# Patient Record
Sex: Female | Born: 1980 | Race: Asian | Hispanic: No | Marital: Married | Smoking: Never smoker
Health system: Southern US, Community
[De-identification: ages and names within clinical notes are randomized; demographics above are authoritative.]

## PROBLEM LIST (undated history)

## (undated) ENCOUNTER — Inpatient Hospital Stay (HOSPITAL_COMMUNITY): Payer: Self-pay

## (undated) DIAGNOSIS — Z8781 Personal history of (healed) traumatic fracture: Secondary | ICD-10-CM

## (undated) HISTORY — PX: NO PAST SURGERIES: SHX2092

---

## 2015-01-20 ENCOUNTER — Encounter (HOSPITAL_COMMUNITY): Payer: Self-pay | Admitting: *Deleted

## 2015-01-20 ENCOUNTER — Emergency Department (HOSPITAL_COMMUNITY): Payer: Medicaid Other

## 2015-01-20 ENCOUNTER — Inpatient Hospital Stay (HOSPITAL_COMMUNITY)
Admission: EM | Admit: 2015-01-20 | Discharge: 2015-01-26 | DRG: 964 | Disposition: A | Discharge status: Home Health | Payer: Medicaid Other | Attending: Surgery | Admitting: Surgery

## 2015-01-20 DIAGNOSIS — S0081XA Abrasion of other part of head, initial encounter: Secondary | ICD-10-CM | POA: Diagnosis present

## 2015-01-20 DIAGNOSIS — S2241XA Multiple fractures of ribs, right side, initial encounter for closed fracture: Secondary | ICD-10-CM | POA: Diagnosis present

## 2015-01-20 DIAGNOSIS — S3210XA Unspecified fracture of sacrum, initial encounter for closed fracture: Secondary | ICD-10-CM | POA: Diagnosis present

## 2015-01-20 DIAGNOSIS — S0181XA Laceration without foreign body of other part of head, initial encounter: Secondary | ICD-10-CM | POA: Diagnosis present

## 2015-01-20 DIAGNOSIS — S32591A Other specified fracture of right pubis, initial encounter for closed fracture: Secondary | ICD-10-CM | POA: Diagnosis present

## 2015-01-20 DIAGNOSIS — S27321A Contusion of lung, unilateral, initial encounter: Secondary | ICD-10-CM | POA: Diagnosis present

## 2015-01-20 DIAGNOSIS — S36115A Moderate laceration of liver, initial encounter: Principal | ICD-10-CM | POA: Diagnosis present

## 2015-01-20 DIAGNOSIS — S32810A Multiple fractures of pelvis with stable disruption of pelvic ring, initial encounter for closed fracture: Secondary | ICD-10-CM

## 2015-01-20 DIAGNOSIS — M542 Cervicalgia: Secondary | ICD-10-CM

## 2015-01-20 DIAGNOSIS — S329XXA Fracture of unspecified parts of lumbosacral spine and pelvis, initial encounter for closed fracture: Secondary | ICD-10-CM | POA: Diagnosis present

## 2015-01-20 DIAGNOSIS — S2231XA Fracture of one rib, right side, initial encounter for closed fracture: Secondary | ICD-10-CM

## 2015-01-20 DIAGNOSIS — D62 Acute posthemorrhagic anemia: Secondary | ICD-10-CM | POA: Diagnosis present

## 2015-01-20 DIAGNOSIS — S32301A Unspecified fracture of right ilium, initial encounter for closed fracture: Secondary | ICD-10-CM | POA: Diagnosis present

## 2015-01-20 DIAGNOSIS — S36113A Laceration of liver, unspecified degree, initial encounter: Secondary | ICD-10-CM

## 2015-01-20 LAB — I-STAT CHEM 8, ED
BUN: 15 mg/dL (ref 6–20)
CALCIUM ION: 1.16 mmol/L (ref 1.12–1.23)
Chloride: 102 mmol/L (ref 101–111)
Creatinine, Ser: 0.9 mg/dL (ref 0.44–1.00)
GLUCOSE: 182 mg/dL — AB (ref 70–99)
HEMATOCRIT: 44 % (ref 36.0–46.0)
HEMOGLOBIN: 15 g/dL (ref 12.0–15.0)
POTASSIUM: 3.1 mmol/L — AB (ref 3.5–5.1)
Sodium: 140 mmol/L (ref 135–145)
TCO2: 21 mmol/L (ref 0–100)

## 2015-01-20 LAB — TYPE AND SCREEN
ABO/RH(D): B POS
ANTIBODY SCREEN: NEGATIVE
UNIT DIVISION: 0
Unit division: 0

## 2015-01-20 LAB — CBC
HCT: 33.6 % — ABNORMAL LOW (ref 36.0–46.0)
HEMATOCRIT: 39 % (ref 36.0–46.0)
HEMATOCRIT: 35.5 % — AB (ref 36.0–46.0)
HEMOGLOBIN: 13.4 g/dL (ref 12.0–15.0)
Hemoglobin: 12 g/dL (ref 12.0–15.0)
Hemoglobin: 11.3 g/dL — ABNORMAL LOW (ref 12.0–15.0)
MCH: 30.5 pg (ref 26.0–34.0)
MCH: 30.3 pg (ref 26.0–34.0)
MCH: 29.9 pg (ref 26.0–34.0)
MCHC: 34.4 g/dL (ref 30.0–36.0)
MCHC: 33.8 g/dL (ref 30.0–36.0)
MCHC: 33.6 g/dL (ref 30.0–36.0)
MCV: 88.8 fL (ref 78.0–100.0)
MCV: 89.6 fL (ref 78.0–100.0)
MCV: 88.9 fL (ref 78.0–100.0)
Platelets: 153 10*3/uL (ref 150–400)
Platelets: 111 10*3/uL — ABNORMAL LOW (ref 150–400)
Platelets: 103 10*3/uL — ABNORMAL LOW (ref 150–400)
RBC: 4.39 MIL/uL (ref 3.87–5.11)
RBC: 3.96 MIL/uL (ref 3.87–5.11)
RBC: 3.78 MIL/uL — AB (ref 3.87–5.11)
RDW: 12.7 % (ref 11.5–15.5)
RDW: 12.8 % (ref 11.5–15.5)
RDW: 12.8 % (ref 11.5–15.5)
WBC: 10.7 10*3/uL — AB (ref 4.0–10.5)
WBC: 13.5 10*3/uL — ABNORMAL HIGH (ref 4.0–10.5)
WBC: 11 10*3/uL — AB (ref 4.0–10.5)

## 2015-01-20 LAB — COMPREHENSIVE METABOLIC PANEL
ALT: 204 U/L — ABNORMAL HIGH (ref 14–54)
AST: 324 U/L — ABNORMAL HIGH (ref 15–41)
Albumin: 3.9 g/dL (ref 3.5–5.0)
Alkaline Phosphatase: 47 U/L (ref 38–126)
Anion gap: 11 (ref 5–15)
BUN: 13 mg/dL (ref 6–20)
CHLORIDE: 103 mmol/L (ref 101–111)
CO2: 23 mmol/L (ref 22–32)
CREATININE: 0.95 mg/dL (ref 0.44–1.00)
Calcium: 8.8 mg/dL — ABNORMAL LOW (ref 8.9–10.3)
Glucose, Bld: 180 mg/dL — ABNORMAL HIGH (ref 70–99)
Potassium: 3.2 mmol/L — ABNORMAL LOW (ref 3.5–5.1)
Sodium: 137 mmol/L (ref 135–145)
Total Bilirubin: 0.9 mg/dL (ref 0.3–1.2)
Total Protein: 6.7 g/dL (ref 6.5–8.1)

## 2015-01-20 LAB — URINE MICROSCOPIC-ADD ON

## 2015-01-20 LAB — URINALYSIS, ROUTINE W REFLEX MICROSCOPIC
Bilirubin Urine: NEGATIVE
Glucose, UA: NEGATIVE mg/dL
KETONES UR: NEGATIVE mg/dL
LEUKOCYTES UA: NEGATIVE
Nitrite: NEGATIVE
PROTEIN: 30 mg/dL — AB
Specific Gravity, Urine: >1.046 — ABNORMAL HIGH (ref 1.005–1.030)
Urobilinogen, UA: 0.2 mg/dL (ref 0.0–1.0)
pH: 5.5 (ref 5.0–8.0)

## 2015-01-20 LAB — MRSA PCR SCREENING: MRSA by PCR: NEGATIVE

## 2015-01-20 LAB — ETHANOL

## 2015-01-20 LAB — POC URINE PREG, ED: PREG TEST UR: NEGATIVE

## 2015-01-20 LAB — PROTIME-INR
INR: 1 (ref 0.00–1.49)
Prothrombin Time: 13.3 seconds (ref 11.6–15.2)

## 2015-01-20 LAB — CDS SEROLOGY

## 2015-01-20 LAB — ABO/RH: ABO/RH(D): B POS

## 2015-01-20 MED ORDER — MORPHINE SULFATE 2 MG/ML IJ SOLN
2.0000 mg | INTRAMUSCULAR | Status: DC | PRN
  Administered 2015-01-20 – 2015-01-23 (×18): 2 mg via INTRAVENOUS
  Filled 2015-01-20 (×18): qty 1

## 2015-01-20 MED ORDER — LIDOCAINE-EPINEPHRINE 2 %-1:100000 IJ SOLN
20.0000 mL | Freq: Once | INTRAMUSCULAR | Status: DC
  Filled 2015-01-20: qty 20

## 2015-01-20 MED ORDER — SODIUM CHLORIDE 0.9 % IV SOLN
INTRAVENOUS | Status: AC | PRN
  Administered 2015-01-20: 100 mL/h via INTRAVENOUS

## 2015-01-20 MED ORDER — ONDANSETRON HCL 4 MG PO TABS: 4.0000 mg | ORAL_TABLET | Freq: Four times a day (QID) | ORAL | Status: DC | PRN

## 2015-01-20 MED ORDER — POTASSIUM CHLORIDE IN NACL 20-0.9 MEQ/L-% IV SOLN
INTRAVENOUS | Status: DC
  Administered 2015-01-20 – 2015-01-24 (×8): via INTRAVENOUS
  Filled 2015-01-20 (×11): qty 1000

## 2015-01-20 MED ORDER — PANTOPRAZOLE SODIUM 40 MG PO TBEC: 40.0000 mg | DELAYED_RELEASE_TABLET | Freq: Every day | ORAL | Status: DC

## 2015-01-20 MED ORDER — PANTOPRAZOLE SODIUM 40 MG IV SOLR
40.0000 mg | Freq: Every day | INTRAVENOUS | Status: DC
  Administered 2015-01-20 – 2015-01-21 (×2): 40 mg via INTRAVENOUS
  Filled 2015-01-20 (×3): qty 40

## 2015-01-20 MED ORDER — ONDANSETRON HCL 4 MG/2ML IJ SOLN
4.0000 mg | Freq: Four times a day (QID) | INTRAMUSCULAR | Status: DC | PRN
  Administered 2015-01-20 – 2015-01-25 (×3): 4 mg via INTRAVENOUS
  Filled 2015-01-20 (×3): qty 2

## 2015-01-20 NOTE — ED Notes (Signed)
Pt taken to CT with RN present
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
## 2015-01-20 NOTE — Consult Note (Signed)
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 RLE No traumatic wounds, ecchymosis, or rash  Nontender  No effusions  Knee stable to varus/ valgus and anterior/posterior stress  Sens DPN, SPN, TN intact  Motor EHL, ext, flex, evers grossly intact  DP 2+, PT 2+, No significant edema LLE No traumatic wounds, ecchymosis, or rash  Nontender  No effusions  Knee stable to varus/ valgus and anterior/posterior stress  Sens DPN, SPN, TN intact  Motor EHL, ext, flex, evers grossly intact  DP 2+, PT 2+, No significant edema    Assessment/Plan: Right LC2 pelvic ring injury 1. Term of bedrest for liver lac should allow fracture to get some stability to reduce pain 2.  We will reassess as patient progresses and after attempt at mobilization in hopes that she will not require an SI screw.  If too much pain and instability to mobilize well, then consider SI screw, likely Thursday.  Altamese Crawford, MD [facility] [phone] (p)   01/20/2015  12:16 PM

## 2015-01-20 NOTE — ED Notes (Signed)
Report called  

## 2015-01-20 NOTE — ED Notes (Signed)
Report given pt transported to 67M bed 10
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
## 2015-01-20 NOTE — ED Provider Notes (Signed)
LACERATION REPAIR Performed by: Sharlene Mottsartner, Jimmey Hengel W Authorized by: Sharlene Mottsartner, Rahmel Nedved W Consent: Verbal consent obtained. Risks and benefits: risks, benefits and alternatives were discussed Consent given by: patient Patient identity confirmed: provided demographic data Prepped and Draped in normal sterile fashion Wound explored  Laceration Location: Right cheek  Laceration Length: 3 cm  No Foreign Bodies seen or palpated  Anesthesia: local infiltration  Local anesthetic: lidocaine 1 % without epinephrine  Anesthetic total: 3 ml  Irrigation method: syringe Amount of cleaning: standard  Skin closure: 6-0 Prolene   Number of sutures: 4   Technique: Simple interrupted   Patient tolerance: Patient tolerated the procedure well with no immediate complications.   Joycie PeekBenjamin Ledford Goodson, PA-C 01/20/15 [id number]  Benjiman CoreNathan Pickering, MD 01/21/15 [phone]

## 2015-01-21 LAB — PREPARE FRESH FROZEN PLASMA
UNIT DIVISION: 0
Unit division: 0

## 2015-01-21 LAB — CBC
HCT: 32.1 % — ABNORMAL LOW (ref 36.0–46.0)
HCT: 32.9 % — ABNORMAL LOW (ref 36.0–46.0)
HEMATOCRIT: 31.6 % — AB (ref 36.0–46.0)
HEMOGLOBIN: 11 g/dL — AB (ref 12.0–15.0)
Hemoglobin: 10.7 g/dL — ABNORMAL LOW (ref 12.0–15.0)
Hemoglobin: 11.2 g/dL — ABNORMAL LOW (ref 12.0–15.0)
MCH: 30.5 pg (ref 26.0–34.0)
MCH: 30.1 pg (ref 26.0–34.0)
MCH: 30.4 pg (ref 26.0–34.0)
MCHC: 34.3 g/dL (ref 30.0–36.0)
MCHC: 33.9 g/dL (ref 30.0–36.0)
MCHC: 34 g/dL (ref 30.0–36.0)
MCV: 88.9 fL (ref 78.0–100.0)
MCV: 89 fL (ref 78.0–100.0)
MCV: 89.4 fL (ref 78.0–100.0)
PLATELETS: 95 10*3/uL — AB (ref 150–400)
PLATELETS: 91 10*3/uL — AB (ref 150–400)
Platelets: 94 10*3/uL — ABNORMAL LOW (ref 150–400)
RBC: 3.61 MIL/uL — AB (ref 3.87–5.11)
RBC: 3.55 MIL/uL — ABNORMAL LOW (ref 3.87–5.11)
RBC: 3.68 MIL/uL — AB (ref 3.87–5.11)
RDW: 13 % (ref 11.5–15.5)
RDW: 13 % (ref 11.5–15.5)
RDW: 13 % (ref 11.5–15.5)
WBC: 9.1 10*3/uL (ref 4.0–10.5)
WBC: 8.3 10*3/uL (ref 4.0–10.5)
WBC: 8 10*3/uL (ref 4.0–10.5)

## 2015-01-21 LAB — COMPREHENSIVE METABOLIC PANEL
ALBUMIN: 3.1 g/dL — AB (ref 3.5–5.0)
ALK PHOS: 32 U/L — AB (ref 38–126)
ALT: 132 U/L — ABNORMAL HIGH (ref 14–54)
ANION GAP: 9 (ref 5–15)
AST: 109 U/L — ABNORMAL HIGH (ref 15–41)
BUN: 8 mg/dL (ref 6–20)
CHLORIDE: 108 mmol/L (ref 101–111)
CO2: 20 mmol/L — ABNORMAL LOW (ref 22–32)
CREATININE: 0.69 mg/dL (ref 0.44–1.00)
Calcium: 8.1 mg/dL — ABNORMAL LOW (ref 8.9–10.3)
GFR calc non Af Amer: >60 mL/min (ref >60)
Glucose, Bld: 86 mg/dL (ref 70–99)
Potassium: 3.8 mmol/L (ref 3.5–5.1)
Sodium: 137 mmol/L (ref 135–145)
Total Bilirubin: 1 mg/dL (ref 0.3–1.2)
Total Protein: 5.6 g/dL — ABNORMAL LOW (ref 6.5–8.1)

## 2015-01-21 MED ORDER — CETYLPYRIDINIUM CHLORIDE 0.05 % MT LIQD
7.0000 mL | Freq: Two times a day (BID) | OROMUCOSAL | Status: DC
  Administered 2015-01-22 (×2): 7 mL via OROMUCOSAL

## 2015-01-21 MED ORDER — CHLORHEXIDINE GLUCONATE 0.12 % MT SOLN
15.0000 mL | Freq: Two times a day (BID) | OROMUCOSAL | Status: DC
  Administered 2015-01-21 – 2015-01-23 (×4): 15 mL via OROMUCOSAL
  Filled 2015-01-21 (×4): qty 15

## 2015-01-21 MED ORDER — BACITRACIN ZINC 500 UNIT/GM EX OINT
TOPICAL_OINTMENT | Freq: Two times a day (BID) | CUTANEOUS | Status: DC
  Administered 2015-01-21 – 2015-01-26 (×11): via TOPICAL
  Filled 2015-01-21 (×2): qty 28.35
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
## 2015-01-22 ENCOUNTER — Inpatient Hospital Stay (HOSPITAL_COMMUNITY): Payer: Medicaid Other

## 2015-01-22 LAB — CBC
HCT: 33.2 % — ABNORMAL LOW (ref 36.0–46.0)
HEMOGLOBIN: 11.1 g/dL — AB (ref 12.0–15.0)
MCH: 30.6 pg (ref 26.0–34.0)
MCHC: 33.4 g/dL (ref 30.0–36.0)
MCV: 91.5 fL (ref 78.0–100.0)
Platelets: 83 10*3/uL — ABNORMAL LOW (ref 150–400)
RBC: 3.63 MIL/uL — AB (ref 3.87–5.11)
RDW: 13 % (ref 11.5–15.5)
WBC: 7.2 10*3/uL (ref 4.0–10.5)

## 2015-01-22 NOTE — Progress Notes (Signed)
UR completed.  Awaiting PT/OT evals to determine pt's needs for surgery, for HH/DME vs CIR.   Carlyle LipaMichelle Shay Jhaveri, RN BSN [facility] CCM Trauma/Neuro ICU Case Manager [phone]
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
## 2015-01-23 ENCOUNTER — Inpatient Hospital Stay (HOSPITAL_COMMUNITY): Payer: Medicaid Other

## 2015-01-23 DIAGNOSIS — D62 Acute posthemorrhagic anemia: Secondary | ICD-10-CM | POA: Diagnosis not present

## 2015-01-23 DIAGNOSIS — S0081XA Abrasion of other part of head, initial encounter: Secondary | ICD-10-CM | POA: Diagnosis present

## 2015-01-23 DIAGNOSIS — S2241XA Multiple fractures of ribs, right side, initial encounter for closed fracture: Secondary | ICD-10-CM | POA: Diagnosis present

## 2015-01-23 LAB — CBC WITH DIFFERENTIAL/PLATELET
Basophils Absolute: 0 10*3/uL (ref 0.0–0.1)
Basophils Relative: 0 % (ref 0–1)
Eosinophils Absolute: 0.2 10*3/uL (ref 0.0–0.7)
Eosinophils Relative: 3 % (ref 0–5)
HCT: 31.8 % — ABNORMAL LOW (ref 36.0–46.0)
Hemoglobin: 10.9 g/dL — ABNORMAL LOW (ref 12.0–15.0)
LYMPHS PCT: 22 % (ref 12–46)
Lymphs Abs: 1.4 10*3/uL (ref 0.7–4.0)
MCH: 30.2 pg (ref 26.0–34.0)
MCHC: 34.3 g/dL (ref 30.0–36.0)
MCV: 88.1 fL (ref 78.0–100.0)
MONOS PCT: 8 % (ref 3–12)
Monocytes Absolute: 0.5 10*3/uL (ref 0.1–1.0)
NEUTROS ABS: 4.2 10*3/uL (ref 1.7–7.7)
NEUTROS PCT: 67 % (ref 43–77)
PLATELETS: 94 10*3/uL — AB (ref 150–400)
RBC: 3.61 MIL/uL — AB (ref 3.87–5.11)
RDW: 12.6 % (ref 11.5–15.5)
WBC: 6.2 10*3/uL (ref 4.0–10.5)

## 2015-01-23 MED ORDER — POLYETHYLENE GLYCOL 3350 17 G PO PACK
17.0000 g | PACK | Freq: Every day | ORAL | Status: DC
  Administered 2015-01-23 – 2015-01-26 (×4): 17 g via ORAL
  Filled 2015-01-23 (×4): qty 1

## 2015-01-23 MED ORDER — HYDROCODONE-ACETAMINOPHEN 10-325 MG PO TABS
0.5000 | ORAL_TABLET | ORAL | Status: DC | PRN
  Administered 2015-01-23: 2 via ORAL
  Administered 2015-01-23: 1 via ORAL
  Administered 2015-01-24 – 2015-01-26 (×7): 2 via ORAL
  Filled 2015-01-23 (×8): qty 2
  Filled 2015-01-23: qty 1

## 2015-01-23 MED ORDER — MORPHINE SULFATE 2 MG/ML IJ SOLN
2.0000 mg | INTRAMUSCULAR | Status: DC | PRN
  Administered 2015-01-23: 2 mg via INTRAVENOUS
  Filled 2015-01-23 (×2): qty 1

## 2015-01-23 MED ORDER — DOCUSATE SODIUM 100 MG PO CAPS
100.0000 mg | ORAL_CAPSULE | Freq: Two times a day (BID) | ORAL | Status: DC
  Administered 2015-01-23 – 2015-01-24 (×4): 100 mg via ORAL
  Filled 2015-01-23 (×3): qty 1
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
## 2015-01-24 LAB — CBC
HCT: 31.5 % — ABNORMAL LOW (ref 36.0–46.0)
Hemoglobin: 10.7 g/dL — ABNORMAL LOW (ref 12.0–15.0)
MCH: 30.1 pg (ref 26.0–34.0)
MCHC: 34 g/dL (ref 30.0–36.0)
MCV: 88.7 fL (ref 78.0–100.0)
PLATELETS: 100 10*3/uL — AB (ref 150–400)
RBC: 3.55 MIL/uL — AB (ref 3.87–5.11)
RDW: 12.7 % (ref 11.5–15.5)
WBC: 5.8 10*3/uL (ref 4.0–10.5)

## 2015-01-24 LAB — BLOOD PRODUCT ORDER (VERBAL) VERIFICATION
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
## 2015-01-25 ENCOUNTER — Inpatient Hospital Stay (HOSPITAL_COMMUNITY): Payer: Medicaid Other

## 2015-01-25 ENCOUNTER — Encounter (HOSPITAL_COMMUNITY): Admission: EM | Disposition: A | Discharge status: Home Health | Payer: Self-pay | Source: Home / Self Care

## 2015-01-25 SURGERY — OPEN REDUCTION INTERNAL FIXATION (ORIF) PELVIC FRACTURE
Anesthesia: General

## 2015-01-25 MED ORDER — DOCUSATE SODIUM 100 MG PO CAPS
200.0000 mg | ORAL_CAPSULE | Freq: Two times a day (BID) | ORAL | Status: DC
  Administered 2015-01-25 – 2015-01-26 (×3): 200 mg via ORAL
  Filled 2015-01-25 (×3): qty 2
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
## 2015-01-25 NOTE — Clinical Social Work Note (Signed)
Clinical Social Worker continuing to follow patient and family for support and discharge planning needs.  Per PT note, patient and sister have verbalized that patient husband is home to provide 24 hour support at this time.  This conflicts with original CSW assessment.  Patient was able to manage stairs and weight bearing as tolerated status.  Patient still complaining of dizziness - therapies to continue to work with her and possibly plan for home with home health.  Patient with no SNF needs at this time.  Clinical Social Worker will sign off for now as social work intervention is no longer needed. Please consult us again if new need arises.  Macario GoldsJesse Cynethia Schindler, Kentucky[place]CSW [phone]
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
## 2015-01-26 ENCOUNTER — Encounter (HOSPITAL_COMMUNITY): Payer: Self-pay

## 2015-01-26 MED ORDER — ONDANSETRON HCL 4 MG PO TABS: 4.0000 mg | ORAL_TABLET | Freq: Four times a day (QID) | ORAL | Status: DC | PRN

## 2015-01-26 MED ORDER — HYDROCODONE-ACETAMINOPHEN 10-325 MG PO TABS: 1.0000 | ORAL_TABLET | ORAL | Status: DC | PRN
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
## 2015-01-26 NOTE — Progress Notes (Signed)
Discharge reviewed with translator [phone][id number]  All discharge information reviewed with patient and medication prescriptions reviewed with patient.  Patient being discharged with equipment.  Patient has husband at bedside to take her home.  Patient will follow up with Dr. Carola FrostHandy.

## 2015-01-26 NOTE — Discharge Instructions (Signed)
Wash wounds daily in shower with soap and water. Apply antibiotic ointment (e.g. Neosporin) twice daily and as needed to keep moist.  No driving until cleared by Dr. Carola FrostHandy  No running, jumping, ball or contact sports, bikes, skateboards, motorcycles, etc for 6 weeks.
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
## 2015-01-26 NOTE — Care Management Note (Signed)
Case Management Note  Patient Details  Name: Jill [name] MRN: [id number] Date of Birth: [birth year]  In-House Referral:  Interpreting Services    DME Arranged:  3-N-1, Dan HumphreysWalker rolling, Tub bench DME Agency:  [facility]   Status of Service:  Completed, signed off  Medicare Important Message Given:  N/A - LOS <3 / Initial given by admissions (Medicaid--IM not required.)       Azucena KubaBryson, Nyzier Boivin Diane, RN 01/26/2015, 9:23 AM

## 2015-02-06 ENCOUNTER — Encounter (HOSPITAL_COMMUNITY): Payer: Self-pay | Admitting: *Deleted

## 2015-02-06 ENCOUNTER — Emergency Department (HOSPITAL_COMMUNITY)
Admission: EM | Admit: 2015-02-06 | Discharge: 2015-02-06 | Disposition: A | Discharge status: Home / Self Care | Payer: Medicaid Other | Source: Home / Self Care | Attending: Family Medicine | Admitting: Family Medicine

## 2015-02-06 DIAGNOSIS — R102 Pelvic and perineal pain: Secondary | ICD-10-CM

## 2015-02-06 DIAGNOSIS — R0789 Other chest pain: Secondary | ICD-10-CM | POA: Diagnosis not present

## 2015-02-06 MED ORDER — HYDROCODONE-ACETAMINOPHEN 7.5-325 MG PO TABS: 1.0000 | ORAL_TABLET | Freq: Four times a day (QID) | ORAL | Status: DC | PRN
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
## 2015-02-06 NOTE — Discharge Instructions (Signed)
See your doctor as planned. Return as needed.
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
## 2015-03-16 DIAGNOSIS — M795 Residual foreign body in soft tissue: Secondary | ICD-10-CM | POA: Insufficient documentation

## 2015-05-17 ENCOUNTER — Ambulatory Visit: Payer: Medicaid Other | Attending: Orthopedic Surgery | Admitting: Physical Therapy

## 2015-05-17 DIAGNOSIS — S329XXA Fracture of unspecified parts of lumbosacral spine and pelvis, initial encounter for closed fracture: Secondary | ICD-10-CM

## 2015-05-17 DIAGNOSIS — M79604 Pain in right leg: Secondary | ICD-10-CM | POA: Diagnosis not present

## 2015-05-18 NOTE — Patient Instructions (Signed)
"  Basic Back Exercise" booklet: including KTC, DKTC, abdominal brace, HS stretch, gastroc stretch and basic core strengthening reps and frequency written on handout.
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
## 2016-06-10 ENCOUNTER — Ambulatory Visit (INDEPENDENT_AMBULATORY_CARE_PROVIDER_SITE_OTHER): Payer: Medicaid Other | Admitting: *Deleted

## 2016-06-10 DIAGNOSIS — Z3201 Encounter for pregnancy test, result positive: Secondary | ICD-10-CM | POA: Diagnosis not present

## 2016-06-10 DIAGNOSIS — Z3491 Encounter for supervision of normal pregnancy, unspecified, first trimester: Secondary | ICD-10-CM

## 2016-06-10 LAB — POCT URINE PREGNANCY: Preg Test, Ur: POSITIVE — AB

## 2016-06-10 MED ORDER — PRENATAL PLUS DHA 7-0.4-100 MG PO TABS: 1.0000 | ORAL_TABLET | Freq: Every day | ORAL | 11 refills | Status: AC

## 2016-06-10 NOTE — Progress Notes (Signed)
1556w4d  EDD 01/17/2016

## 2016-07-02 ENCOUNTER — Other Ambulatory Visit (HOSPITAL_COMMUNITY)
Admission: RE | Admit: 2016-07-02 | Discharge: 2016-07-02 | Disposition: A | Discharge status: Home / Self Care | Payer: Medicaid Other | Source: Ambulatory Visit | Attending: Certified Nurse Midwife | Admitting: Certified Nurse Midwife

## 2016-07-02 ENCOUNTER — Ambulatory Visit (INDEPENDENT_AMBULATORY_CARE_PROVIDER_SITE_OTHER): Payer: Medicaid Other | Admitting: Certified Nurse Midwife

## 2016-07-02 ENCOUNTER — Encounter: Payer: Self-pay | Admitting: Certified Nurse Midwife

## 2016-07-02 VITALS — BP 104/71 | HR 78 | Temp 99.0°F | Ht 61.0 in | Wt 116.4 lb

## 2016-07-02 DIAGNOSIS — Z01419 Encounter for gynecological examination (general) (routine) without abnormal findings: Secondary | ICD-10-CM | POA: Insufficient documentation

## 2016-07-02 DIAGNOSIS — Z113 Encounter for screening for infections with a predominantly sexual mode of transmission: Secondary | ICD-10-CM | POA: Diagnosis present

## 2016-07-02 DIAGNOSIS — N760 Acute vaginitis: Secondary | ICD-10-CM | POA: Diagnosis present

## 2016-07-02 DIAGNOSIS — Z1151 Encounter for screening for human papillomavirus (HPV): Secondary | ICD-10-CM | POA: Diagnosis present

## 2016-07-02 DIAGNOSIS — O219 Vomiting of pregnancy, unspecified: Secondary | ICD-10-CM | POA: Diagnosis not present

## 2016-07-02 DIAGNOSIS — Z3481 Encounter for supervision of other normal pregnancy, first trimester: Secondary | ICD-10-CM

## 2016-07-02 DIAGNOSIS — Z3480 Encounter for supervision of other normal pregnancy, unspecified trimester: Secondary | ICD-10-CM
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
## 2016-07-03 ENCOUNTER — Encounter: Payer: Self-pay | Admitting: Certified Nurse Midwife

## 2016-07-03 DIAGNOSIS — Z3480 Encounter for supervision of other normal pregnancy, unspecified trimester: Secondary | ICD-10-CM | POA: Insufficient documentation

## 2016-07-03 MED ORDER — DOXYLAMINE-PYRIDOXINE 10-10 MG PO TBEC: DELAYED_RELEASE_TABLET | ORAL | 4 refills | Status: DC

## 2016-07-04 ENCOUNTER — Telehealth: Payer: Self-pay | Admitting: *Deleted

## 2016-07-04 LAB — CYTOLOGY - PAP
DIAGNOSIS: NEGATIVE
HPV (WINDOPATH): NOT DETECTED

## 2016-07-04 LAB — CERVICOVAGINAL ANCILLARY ONLY
CHLAMYDIA, DNA PROBE: NEGATIVE
Neisseria Gonorrhea: NEGATIVE
Trichomonas: NEGATIVE

## 2016-07-04 NOTE — Telephone Encounter (Signed)
PA was submitted and approved for [facility]. Pharmacy made aware.

## 2016-07-09 LAB — PRENATAL PROFILE I(LABCORP)
ANTIBODY SCREEN: NEGATIVE
BASOS: 0 %
Basophils Absolute: 0 10*3/uL (ref 0.0–0.2)
EOS (ABSOLUTE): 0.2 10*3/uL (ref 0.0–0.4)
Eos: 2 %
HEMATOCRIT: 39.7 % (ref 34.0–46.6)
HEP B S AG: NEGATIVE
Hemoglobin: 13.3 g/dL (ref 11.1–15.9)
IMMATURE GRANS (ABS): 0 10*3/uL (ref 0.0–0.1)
Immature Granulocytes: 0 %
Lymphocytes Absolute: 2.1 10*3/uL (ref 0.7–3.1)
Lymphs: 20 %
MCH: 30.6 pg (ref 26.6–33.0)
MCHC: 33.5 g/dL (ref 31.5–35.7)
MCV: 91 fL (ref 79–97)
MONOCYTES: 6 %
MONOS ABS: 0.6 10*3/uL (ref 0.1–0.9)
NEUTROS ABS: 7.6 10*3/uL — AB (ref 1.4–7.0)
Neutrophils: 72 %
Platelets: 173 10*3/uL (ref 150–379)
RBC: 4.35 x10E6/uL (ref 3.77–5.28)
RDW: 14.7 % (ref 12.3–15.4)
RPR: NONREACTIVE
RUBELLA: 5.23 {index} (ref >0.99)
Rh Factor: POSITIVE
WBC: 10.5 10*3/uL (ref 3.4–10.8)

## 2016-07-09 LAB — MATERNIT21 PLUS CORE+SCA
CHROMOSOME 18: NEGATIVE
CHROMOSOME 21: NEGATIVE
Chromosome 13: NEGATIVE
PDF: 0
Y CHROMOSOME: NOT DETECTED

## 2016-07-09 LAB — HEMOGLOBINOPATHY EVALUATION
HEMOGLOBIN A2 QUANTITATION: 2.6 % (ref 0.7–3.1)
HGB C: 0 %
HGB S: 0 %
Hemoglobin F Quantitation: 0.7 % (ref 0.0–2.0)
Hgb A: 96.7 % (ref 94.0–98.0)

## 2016-07-09 LAB — CERVICOVAGINAL ANCILLARY ONLY
BACTERIAL VAGINITIS: NEGATIVE
Candida vaginitis: NEGATIVE

## 2016-07-09 LAB — HEMOGLOBIN A1C
Est. average glucose Bld gHb Est-mCnc: 97 mg/dL
Hgb A1c MFr Bld: 5 % (ref 4.8–5.6)

## 2016-07-09 LAB — HIV ANTIBODY (ROUTINE TESTING W REFLEX): HIV Screen 4th Generation wRfx: NONREACTIVE

## 2016-07-09 LAB — VARICELLA ZOSTER ANTIBODY, IGG: Varicella zoster IgG: 516 index (ref >165)

## 2016-07-09 LAB — TOXASSURE SELECT 13 (MW), URINE

## 2016-07-09 LAB — TSH: TSH: 1.25 u[IU]/mL (ref 0.450–4.500)

## 2016-07-10 ENCOUNTER — Other Ambulatory Visit: Payer: Self-pay | Admitting: Certified Nurse Midwife

## 2016-07-29 ENCOUNTER — Ambulatory Visit (INDEPENDENT_AMBULATORY_CARE_PROVIDER_SITE_OTHER): Payer: Medicaid Other | Admitting: Certified Nurse Midwife

## 2016-07-29 VITALS — BP 104/69 | HR 83 | Wt 123.0 lb

## 2016-07-29 DIAGNOSIS — Z3480 Encounter for supervision of other normal pregnancy, unspecified trimester: Secondary | ICD-10-CM

## 2016-07-29 DIAGNOSIS — Z3482 Encounter for supervision of other normal pregnancy, second trimester: Secondary | ICD-10-CM
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
## 2016-07-30 LAB — DRUG SCREEN 764883 11+OXYCO+ALC+CRT-BUND
Amphetamines, Urine: NEGATIVE ng/mL
BENZODIAZ UR QL: NEGATIVE ng/mL
Barbiturate: NEGATIVE ng/mL
CANNABINOID QUANT UR: NEGATIVE ng/mL
COCAINE (METABOLITE): NEGATIVE ng/mL
Creatinine: 28.6 mg/dL (ref 20.0–300.0)
ETHANOL: NEGATIVE %
MEPERIDINE: NEGATIVE ng/mL
METHADONE SCREEN, URINE: NEGATIVE ng/mL
OPIATE SCREEN URINE: NEGATIVE ng/mL
OXYCODONE+OXYMORPHONE UR QL SCN: NEGATIVE ng/mL
PH OF URINE: 6.8 (ref 4.5–8.9)
PHENCYCLIDINE: NEGATIVE ng/mL
PROPOXYPHENE: NEGATIVE ng/mL
Tramadol: NEGATIVE ng/mL
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
## 2016-08-19 NOTE — Addendum Note (Signed)
Addended by: Pennie BanterSMITH, Kirstie Larsen W on: 08/19/2016 02:03 PM   Modules accepted: Orders

## 2016-08-26 ENCOUNTER — Ambulatory Visit (INDEPENDENT_AMBULATORY_CARE_PROVIDER_SITE_OTHER): Payer: Medicaid Other | Admitting: Obstetrics & Gynecology

## 2016-08-26 ENCOUNTER — Ambulatory Visit (INDEPENDENT_AMBULATORY_CARE_PROVIDER_SITE_OTHER): Payer: Medicaid Other

## 2016-08-26 VITALS — BP 106/69 | HR 91 | Wt 125.0 lb

## 2016-08-26 DIAGNOSIS — Z3482 Encounter for supervision of other normal pregnancy, second trimester: Secondary | ICD-10-CM

## 2016-08-26 DIAGNOSIS — Z3480 Encounter for supervision of other normal pregnancy, unspecified trimester: Secondary | ICD-10-CM

## 2016-08-28 ENCOUNTER — Other Ambulatory Visit: Payer: Self-pay | Admitting: Certified Nurse Midwife

## 2016-08-28 DIAGNOSIS — Z3480 Encounter for supervision of other normal pregnancy, unspecified trimester: Secondary | ICD-10-CM

## 2016-09-15 NOTE — L&D Delivery Note (Signed)
Operative Delivery Note At 8:24 PM a viable female was delivered via Vaginal, Vacuum Investment banker, operational).  Presentation: vertex; +2 Position: Left,, Occiput,, Anterior; Station: +2.  Verbal consent: unable to obtain verbal consent due to language barrier.  Risks and benefits discussed in detail.  Risks include, but are not limited to the risks of anesthesia, bleeding, infection, damage to maternal tissues, fetal cephalhematoma.  There is also the risk of inability to effect vaginal delivery of the head, or shoulder dystocia that cannot be resolved by established maneuvers, leading to the need for emergency cesarean section.  APGAR: 7, 9; weight 8 lb 4.8 oz (3765 g).   Placenta status: spont via shultz, .   Cord:3vc  with the following complications none: .  Cord pH: n/a  Anesthesia: none  Instruments: kiwi vac Episiotomy: None Lacerations: None Suture Repair: n/a Est. Blood Loss (mL): 100  Mom to postpartum.  Baby to Couplet care / Skin to Skin.  Wyvonnia Dusky 01/04/2017, 8:43 PM

## 2016-09-23 ENCOUNTER — Ambulatory Visit (INDEPENDENT_AMBULATORY_CARE_PROVIDER_SITE_OTHER): Payer: Medicaid Other | Admitting: Certified Nurse Midwife

## 2016-09-23 DIAGNOSIS — Z3480 Encounter for supervision of other normal pregnancy, unspecified trimester: Secondary | ICD-10-CM

## 2016-09-23 DIAGNOSIS — Z3482 Encounter for supervision of other normal pregnancy, second trimester: Secondary | ICD-10-CM
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
## 2016-10-13 ENCOUNTER — Encounter (HOSPITAL_COMMUNITY): Payer: Self-pay

## 2016-10-13 ENCOUNTER — Inpatient Hospital Stay (HOSPITAL_COMMUNITY)
Admission: AD | Admit: 2016-10-13 | Discharge: 2016-10-13 | Disposition: A | Discharge status: Home / Self Care | Payer: Medicaid Other | Source: Ambulatory Visit | Attending: Obstetrics & Gynecology | Admitting: Obstetrics & Gynecology

## 2016-10-13 DIAGNOSIS — R1010 Upper abdominal pain, unspecified: Secondary | ICD-10-CM | POA: Diagnosis not present

## 2016-10-13 DIAGNOSIS — Z3A26 26 weeks gestation of pregnancy: Secondary | ICD-10-CM | POA: Insufficient documentation

## 2016-10-13 DIAGNOSIS — K219 Gastro-esophageal reflux disease without esophagitis: Secondary | ICD-10-CM | POA: Diagnosis not present

## 2016-10-13 DIAGNOSIS — R109 Unspecified abdominal pain: Secondary | ICD-10-CM

## 2016-10-13 DIAGNOSIS — O26892 Other specified pregnancy related conditions, second trimester: Secondary | ICD-10-CM | POA: Diagnosis not present

## 2016-10-13 DIAGNOSIS — Z79899 Other long term (current) drug therapy: Secondary | ICD-10-CM | POA: Insufficient documentation

## 2016-10-13 DIAGNOSIS — O9989 Other specified diseases and conditions complicating pregnancy, childbirth and the puerperium: Secondary | ICD-10-CM

## 2016-10-13 DIAGNOSIS — O99612 Diseases of the digestive system complicating pregnancy, second trimester: Secondary | ICD-10-CM | POA: Insufficient documentation

## 2016-10-13 LAB — URINALYSIS, ROUTINE W REFLEX MICROSCOPIC
Bilirubin Urine: NEGATIVE
Glucose, UA: 50 mg/dL — AB
Hgb urine dipstick: NEGATIVE
Ketones, ur: NEGATIVE mg/dL
LEUKOCYTES UA: NEGATIVE
NITRITE: NEGATIVE
PH: 6 (ref 5.0–8.0)
Protein, ur: NEGATIVE mg/dL
SPECIFIC GRAVITY, URINE: 1.019 (ref 1.005–1.030)

## 2016-10-13 LAB — RAPID URINE DRUG SCREEN, HOSP PERFORMED
AMPHETAMINES: NOT DETECTED
BARBITURATES: NOT DETECTED
BENZODIAZEPINES: NOT DETECTED
COCAINE: NOT DETECTED
Opiates: NOT DETECTED
TETRAHYDROCANNABINOL: NOT DETECTED

## 2016-10-13 MED ORDER — GI COCKTAIL ~~LOC~~
30.0000 mL | Freq: Once | ORAL | Status: AC
  Administered 2016-10-13: 30 mL via ORAL
  Filled 2016-10-13: qty 30

## 2016-10-13 MED ORDER — METOCLOPRAMIDE HCL 10 MG PO TABS: 10.0000 mg | ORAL_TABLET | Freq: Three times a day (TID) | ORAL | 1 refills | Status: DC

## 2016-10-13 MED ORDER — RANITIDINE HCL 150 MG PO TABS: 150.0000 mg | ORAL_TABLET | Freq: Two times a day (BID) | ORAL | 0 refills | Status: DC

## 2016-10-13 NOTE — MAU Note (Signed)
Patient presents with c/o upper abdominal pain that has been on and off for a week. Patient states that she was given omeprazole for this pain before and it helped. Patient denies any vaginal bleeding or LOF. Fetus active.

## 2016-10-13 NOTE — MAU Provider Note (Signed)
History     CSN: [id number]  Arrival date and time: 10/13/16 1235   First Provider Initiated Contact with Patient 10/13/16 1345      Chief Complaint  Patient presents with  . Abdominal Pain   HPI    Ms.Jill [name] is a 36 y.o. female G46P2002 @ 101w3d here with upper abdominal pain; The pain is just below her breasts. The pain started a few weeks ago; the pain is consistent with pain she has had with acid reflux in the past. She has tried omeprazole in her last pregnancy in the past and that helped.   OB History    Gravida Para Term Preterm AB Living   3 2 2     2    SAB TAB Ectopic Multiple Live Births                  History reviewed. No pertinent past medical history.  History reviewed. No pertinent surgical history.  History reviewed. No pertinent family history.  Social History  Substance Use Topics  . Smoking status: Never Smoker  . Smokeless tobacco: Not on file  . Alcohol use No    Allergies: No Known Allergies  Prescriptions Prior to Admission  Medication Sig Dispense Refill Last Dose  . Prenatal Vit-Fe Fumarate-FA (MULTIVITAMIN-PRENATAL) 27-0.8 MG TABS tablet Take 1 tablet by mouth daily at 12 noon.   10/12/2016 at Unknown time  . Doxylamine-Pyridoxine (DICLEGIS) 10-10 MG TBEC Take 1 tablet with breakfast and lunch.  Take 2 tablets at bedtime. (Patient not taking: Reported on 10/13/2016) 100 tablet 4 Not Taking at Unknown time   Results for orders placed or performed during the hospital encounter of 10/13/16 (from the past 48 hour(s))  Urine rapid drug screen (hosp performed)     Status: None   Collection Time: 10/13/16  1:33 PM  Result Value Ref Range   Opiates NONE DETECTED NONE DETECTED   Cocaine NONE DETECTED NONE DETECTED   Benzodiazepines NONE DETECTED NONE DETECTED   Amphetamines NONE DETECTED NONE DETECTED   Tetrahydrocannabinol NONE DETECTED NONE DETECTED   Barbiturates NONE DETECTED NONE DETECTED    Comment:        DRUG SCREEN FOR MEDICAL  PURPOSES ONLY.  IF CONFIRMATION IS NEEDED FOR ANY PURPOSE, NOTIFY LAB WITHIN 5 DAYS.        LOWEST DETECTABLE LIMITS FOR URINE DRUG SCREEN Drug Class       Cutoff (ng/mL) Amphetamine      1000 Barbiturate      200 Benzodiazepine   200 Tricyclics       300 Opiates          300 Cocaine          300 THC              50   Urinalysis, Routine w reflex microscopic     Status: Abnormal   Collection Time: 10/13/16  1:37 PM  Result Value Ref Range   Color, Urine YELLOW YELLOW   APPearance HAZY (A) CLEAR   Specific Gravity, Urine 1.019 1.005 - 1.030   pH 6.0 5.0 - 8.0   Glucose, UA 50 (A) NEGATIVE mg/dL   Hgb urine dipstick NEGATIVE NEGATIVE   Bilirubin Urine NEGATIVE NEGATIVE   Ketones, ur NEGATIVE NEGATIVE mg/dL   Protein, ur NEGATIVE NEGATIVE mg/dL   Nitrite NEGATIVE NEGATIVE   Leukocytes, UA NEGATIVE NEGATIVE    Review of Systems  Constitutional: Negative for fever.  Gastrointestinal: Positive for abdominal pain (Upper abdominal  pain only ) and nausea. Negative for vomiting.  Genitourinary: Negative for vaginal bleeding.   Physical Exam   Blood pressure 112/82, pulse 93, temperature 98.2 F (36.8 C), temperature source Oral, resp. rate 18, last menstrual period 04/11/2016, SpO2 100 %.  Physical Exam  Constitutional: She is oriented to person, place, and time. She appears well-developed and well-nourished. No distress.  HENT:  Head: Normocephalic.  Eyes: Pupils are equal, round, and reactive to light.  Cardiovascular: Normal rate and regular rhythm.   Respiratory: Effort normal.  GI: Soft. Normal appearance. There is tenderness in the epigastric area. There is no rigidity, no rebound and no guarding.    Genitourinary:  Genitourinary Comments:    Musculoskeletal: Normal range of motion.  Neurological: She is alert and oriented to person, place, and time.  Skin: Skin is warm. She is not diaphoretic.  Psychiatric: Her behavior is normal.   Fetal Tracing: Baseline:  150 bpm Variability: moderate  Accelerations: 10x10 Decelerations: none Toco: occasional UI  MAU Course  Procedures  None  MDM  GI cocktail given: pain down from a 5/10 to 3/10.  Pulse ox 100 % on RA   Assessment and Plan    A:  1. Gastroesophageal reflux disease, esophagitis presence not specified   2. Upper abdominal pain     P:  Discharge home in stable condition Rx: Zantac, reglan Strict return precautions, return to MAU if symptoms worsen Follow up with OB as scheduled    Duane LopeJennifer I Tarra Pence, NP 10/13/2016 9:03 PM

## 2016-10-13 NOTE — Discharge Instructions (Signed)
Gastroesophageal Reflux Scan A gastroesophageal reflux scan is a procedure that is used to check for gastroesophageal reflux, which is the backward flow of stomach contents into the tube that carries food from the mouth to the stomach (esophagus). The scan can also show if any stomach contents are inhaled (aspirated) into your lungs. You may need this scan if you have symptoms such as heartburn, vomiting, swallowing problems, or regurgitation. Regurgitation means that swallowed food is returning from the stomach to the esophagus. For this scan, you will drink a liquid that contains a small amount of a radioactive substance (tracer). A scanner with a camera that detects the radioactive tracer is used to see if any of the material backs up into your esophagus. Tell a health care provider about:  Any allergies you have.  All medicines you are taking, including vitamins, herbs, eye drops, creams, and over-the-counter medicines.  Any blood disorders you have.  Any surgeries you have had.  Any medical conditions you have.  If you are pregnant or you think that you may be pregnant.  If you are breastfeeding. What are the risks? Generally, this is a safe procedure. However, problems may occur, including:  Exposure to radiation (a small amount).  Allergic reaction to the radioactive substance. This is rare. What happens before the procedure?  Ask your health care provider about changing or stopping your regular medicines. This is especially important if you are taking diabetes medicines or blood thinners.  Follow your health care providers instructions about eating or drinking restrictions. What happens during the procedure?  You will be asked to drink a liquid that contains a small amount of a radioactive tracer. This liquid will probably be similar to orange juice.  You will assume a position lying on your back.  A series of images will be taken of your esophagus and upper  stomach.  You may be asked to move into different positions to help determine if reflux occurs more often when you are in specific positions.  For adults, an abdominal binder with an inflatable cuff may be placed on the belly (abdomen). This may be used to increase abdominal pressure. More images will be taken to see if the increased pressure causes reflux to occur. The procedure may vary among health care providers and hospitals. What happens after the procedure?  Return to your normal activities and your normal diet as directed by your health care provider.  The radioactive tracer will leave your body over the next few days. Drink enough fluid to keep your urine clear or pale yellow. This will help to flush the tracer out of your body.  It is your responsibility to obtain your test results. Ask your health care provider or the department performing the test when and how you will get your results. This information is not intended to replace advice given to you by your health care provider. Make sure you discuss any questions you have with your health care provider. Document Released: 10/23/2005 Document Revised: 05/26/2016 Document Reviewed: 06/13/2014 Elsevier Interactive Patient Education  2017 [employer] Food Choices for Gastroesophageal Reflux Disease, Adult When you have gastroesophageal reflux disease (GERD), the foods you eat and your eating habits are very important. Choosing the right foods can help ease your discomfort. What guidelines do I need to follow?  Choose fruits, vegetables, whole grains, and low-fat dairy products.  Choose low-fat meat, fish, and poultry.  Limit fats such as oils, salad dressings, butter, nuts, and avocado.  Keep a food  diary. This helps you identify foods that cause symptoms.  Avoid foods that cause symptoms. These may be different for everyone.  Eat small meals often instead of 3 large meals a day.  Eat your meals slowly, in a place where  you are relaxed.  Limit fried foods.  Cook foods using methods other than frying.  Avoid drinking alcohol.  Avoid drinking large amounts of liquids with your meals.  Avoid bending over or lying down until 2-3 hours after eating. What foods are not recommended? These are some foods and drinks that may make your symptoms worse: Vegetables  Tomatoes. Tomato juice. Tomato and spaghetti sauce. Chili peppers. Onion and garlic. Horseradish. Fruits  Oranges, grapefruit, and lemon (fruit and juice). Meats  High-fat meats, fish, and poultry. This includes hot dogs, ribs, ham, sausage, salami, and bacon. Dairy  Whole milk and chocolate milk. Sour cream. Cream. Butter. Ice cream. Cream cheese. Drinks  Coffee and tea. Bubbly (carbonated) drinks or energy drinks. Condiments  Hot sauce. Barbecue sauce. Sweets/Desserts  Chocolate and cocoa. Donuts. Peppermint and spearmint. Fats and Oils  High-fat foods. This includes [place] fries and potato chips. Other  Vinegar. Strong spices. This includes black pepper, white pepper, red pepper, cayenne, curry powder, cloves, ginger, and chili powder. The items listed above may not be a complete list of foods and drinks to avoid. Contact your dietitian for more information.  This information is not intended to replace advice given to you by your health care provider. Make sure you discuss any questions you have with your health care provider. Document Released: 03/02/2012 Document Revised: 02/07/2016 Document Reviewed: 07/06/2013 Elsevier Interactive Patient Education  2017 [employer].

## 2016-10-14 ENCOUNTER — Other Ambulatory Visit: Payer: Medicaid Other

## 2016-10-20 ENCOUNTER — Ambulatory Visit (INDEPENDENT_AMBULATORY_CARE_PROVIDER_SITE_OTHER): Payer: Medicaid Other | Admitting: Certified Nurse Midwife

## 2016-10-20 ENCOUNTER — Other Ambulatory Visit: Payer: Medicaid Other

## 2016-10-20 VITALS — BP 102/66 | HR 97 | Wt 136.0 lb

## 2016-10-20 DIAGNOSIS — Z3482 Encounter for supervision of other normal pregnancy, second trimester: Secondary | ICD-10-CM

## 2016-10-20 DIAGNOSIS — Z3480 Encounter for supervision of other normal pregnancy, unspecified trimester: Secondary | ICD-10-CM

## 2016-10-20 DIAGNOSIS — Z789 Other specified health status: Secondary | ICD-10-CM | POA: Insufficient documentation
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
## 2016-10-21 ENCOUNTER — Other Ambulatory Visit: Payer: Self-pay | Admitting: Certified Nurse Midwife

## 2016-10-21 DIAGNOSIS — Z3480 Encounter for supervision of other normal pregnancy, unspecified trimester: Secondary | ICD-10-CM

## 2016-10-21 LAB — RPR: RPR: NONREACTIVE

## 2016-10-21 LAB — CBC
HEMATOCRIT: 37.4 % (ref 34.0–46.6)
Hemoglobin: 12.5 g/dL (ref 11.1–15.9)
MCH: 31.3 pg (ref 26.6–33.0)
MCHC: 33.4 g/dL (ref 31.5–35.7)
MCV: 94 fL (ref 79–97)
PLATELETS: 162 10*3/uL (ref 150–379)
RBC: 4 x10E6/uL (ref 3.77–5.28)
RDW: 14.3 % (ref 12.3–15.4)
WBC: 12.2 10*3/uL — AB (ref 3.4–10.8)

## 2016-10-21 LAB — GLUCOSE TOLERANCE, 2 HOURS W/ 1HR
GLUCOSE, 2 HOUR: 110 mg/dL (ref 65–152)
Glucose, 1 hour: 133 mg/dL (ref 65–179)
Glucose, Fasting: 75 mg/dL (ref 65–91)

## 2016-10-21 LAB — HIV ANTIBODY (ROUTINE TESTING W REFLEX): HIV Screen 4th Generation wRfx: NONREACTIVE

## 2016-11-03 ENCOUNTER — Ambulatory Visit (INDEPENDENT_AMBULATORY_CARE_PROVIDER_SITE_OTHER): Payer: Medicaid Other | Admitting: Certified Nurse Midwife

## 2016-11-03 VITALS — BP 108/70 | HR 99 | Wt 137.0 lb

## 2016-11-03 DIAGNOSIS — Z3480 Encounter for supervision of other normal pregnancy, unspecified trimester: Secondary | ICD-10-CM

## 2016-11-03 DIAGNOSIS — Z3483 Encounter for supervision of other normal pregnancy, third trimester: Secondary | ICD-10-CM

## 2016-11-03 DIAGNOSIS — Z23 Encounter for immunization: Secondary | ICD-10-CM

## 2016-11-03 DIAGNOSIS — Z789 Other specified health status: Secondary | ICD-10-CM
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
## 2016-11-03 NOTE — Progress Notes (Signed)
Pt is having some pelvic pressure, pain.

## 2016-11-17 ENCOUNTER — Ambulatory Visit (INDEPENDENT_AMBULATORY_CARE_PROVIDER_SITE_OTHER): Payer: Medicaid Other | Admitting: Certified Nurse Midwife

## 2016-11-17 VITALS — BP 109/82 | HR 109 | Wt 140.8 lb

## 2016-11-17 DIAGNOSIS — Z3480 Encounter for supervision of other normal pregnancy, unspecified trimester: Secondary | ICD-10-CM

## 2016-11-17 DIAGNOSIS — Z789 Other specified health status: Secondary | ICD-10-CM

## 2016-11-17 DIAGNOSIS — Z3483 Encounter for supervision of other normal pregnancy, third trimester: Secondary | ICD-10-CM

## 2016-11-17 NOTE — Progress Notes (Signed)
Patient reports she is doing well 
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
## 2016-12-01 ENCOUNTER — Ambulatory Visit (INDEPENDENT_AMBULATORY_CARE_PROVIDER_SITE_OTHER): Payer: Medicaid Other | Admitting: Certified Nurse Midwife

## 2016-12-01 ENCOUNTER — Encounter: Payer: Self-pay | Admitting: Certified Nurse Midwife

## 2016-12-01 VITALS — BP 94/64 | HR 84 | Temp 99.1°F | Wt 141.6 lb

## 2016-12-01 DIAGNOSIS — Z3483 Encounter for supervision of other normal pregnancy, third trimester: Secondary | ICD-10-CM

## 2016-12-01 DIAGNOSIS — Z789 Other specified health status: Secondary | ICD-10-CM

## 2016-12-01 DIAGNOSIS — Z3480 Encounter for supervision of other normal pregnancy, unspecified trimester: Secondary | ICD-10-CM
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
## 2016-12-15 ENCOUNTER — Encounter: Payer: Self-pay | Admitting: Certified Nurse Midwife

## 2016-12-15 ENCOUNTER — Other Ambulatory Visit (HOSPITAL_COMMUNITY)
Admission: RE | Admit: 2016-12-15 | Discharge: 2016-12-15 | Disposition: A | Discharge status: Home / Self Care | Payer: Medicaid Other | Source: Ambulatory Visit | Attending: Certified Nurse Midwife | Admitting: Certified Nurse Midwife

## 2016-12-15 ENCOUNTER — Ambulatory Visit (INDEPENDENT_AMBULATORY_CARE_PROVIDER_SITE_OTHER): Payer: Medicaid Other | Admitting: Certified Nurse Midwife

## 2016-12-15 VITALS — BP 102/68 | HR 96 | Wt 143.8 lb

## 2016-12-15 DIAGNOSIS — Z3483 Encounter for supervision of other normal pregnancy, third trimester: Secondary | ICD-10-CM | POA: Diagnosis not present

## 2016-12-15 DIAGNOSIS — Z3A35 35 weeks gestation of pregnancy: Secondary | ICD-10-CM | POA: Insufficient documentation

## 2016-12-15 DIAGNOSIS — Z3480 Encounter for supervision of other normal pregnancy, unspecified trimester: Secondary | ICD-10-CM

## 2016-12-15 DIAGNOSIS — Z789 Other specified health status: Secondary | ICD-10-CM

## 2016-12-15 LAB — OB RESULTS CONSOLE GBS: STREP GROUP B AG: NEGATIVE

## 2016-12-15 MED ORDER — PRENATE PIXIE 10-0.6-0.4-200 MG PO CAPS: 1.0000 | ORAL_CAPSULE | Freq: Every day | ORAL | 12 refills | Status: DC
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
## 2016-12-15 NOTE — Addendum Note (Signed)
Addended by: Elby Beck F on: 12/15/2016 11:56 AM   Modules accepted: Orders

## 2016-12-16 LAB — CERVICOVAGINAL ANCILLARY ONLY
Bacterial vaginitis: NEGATIVE
Candida vaginitis: POSITIVE — AB
Chlamydia: NEGATIVE
Neisseria Gonorrhea: NEGATIVE
Trichomonas: NEGATIVE

## 2016-12-17 LAB — STREP GP B NAA: STREP GROUP B AG: NEGATIVE

## 2016-12-18 ENCOUNTER — Encounter: Payer: Self-pay | Admitting: *Deleted

## 2016-12-18 ENCOUNTER — Other Ambulatory Visit: Payer: Self-pay | Admitting: Certified Nurse Midwife

## 2016-12-18 DIAGNOSIS — B373 Candidiasis of vulva and vagina: Secondary | ICD-10-CM

## 2016-12-18 DIAGNOSIS — B3731 Acute candidiasis of vulva and vagina: Secondary | ICD-10-CM

## 2016-12-18 MED ORDER — FLUCONAZOLE 150 MG PO TABS: 150.0000 mg | ORAL_TABLET | Freq: Once | ORAL | 0 refills | Status: AC

## 2016-12-24 ENCOUNTER — Ambulatory Visit (INDEPENDENT_AMBULATORY_CARE_PROVIDER_SITE_OTHER): Payer: Medicaid Other | Admitting: Certified Nurse Midwife

## 2016-12-24 ENCOUNTER — Encounter: Payer: Self-pay | Admitting: Certified Nurse Midwife

## 2016-12-24 VITALS — BP 107/74 | HR 93 | Wt 145.6 lb

## 2016-12-24 DIAGNOSIS — Z3483 Encounter for supervision of other normal pregnancy, third trimester: Secondary | ICD-10-CM

## 2016-12-24 DIAGNOSIS — Z3480 Encounter for supervision of other normal pregnancy, unspecified trimester: Secondary | ICD-10-CM

## 2016-12-24 DIAGNOSIS — Z789 Other specified health status: Secondary | ICD-10-CM

## 2016-12-24 NOTE — Progress Notes (Signed)
Patient had questions about yeast.
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
## 2017-01-01 ENCOUNTER — Ambulatory Visit (INDEPENDENT_AMBULATORY_CARE_PROVIDER_SITE_OTHER): Payer: Medicaid Other | Admitting: Certified Nurse Midwife

## 2017-01-01 VITALS — BP 105/71 | HR 99 | Wt 148.0 lb

## 2017-01-01 DIAGNOSIS — Z789 Other specified health status: Secondary | ICD-10-CM

## 2017-01-01 DIAGNOSIS — Z3480 Encounter for supervision of other normal pregnancy, unspecified trimester: Secondary | ICD-10-CM

## 2017-01-01 DIAGNOSIS — Z3483 Encounter for supervision of other normal pregnancy, third trimester: Secondary | ICD-10-CM
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
## 2017-01-04 ENCOUNTER — Encounter (HOSPITAL_COMMUNITY): Payer: Self-pay | Admitting: *Deleted

## 2017-01-04 ENCOUNTER — Inpatient Hospital Stay (HOSPITAL_COMMUNITY)
Admission: AD | Admit: 2017-01-04 | Discharge: 2017-01-06 | DRG: 775 | Disposition: A | Discharge status: Home / Self Care | Payer: Medicaid Other | Source: Ambulatory Visit | Attending: Obstetrics & Gynecology | Admitting: Obstetrics & Gynecology

## 2017-01-04 DIAGNOSIS — O4292 Full-term premature rupture of membranes, unspecified as to length of time between rupture and onset of labor: Secondary | ICD-10-CM | POA: Diagnosis present

## 2017-01-04 DIAGNOSIS — Z3A38 38 weeks gestation of pregnancy: Secondary | ICD-10-CM | POA: Diagnosis not present

## 2017-01-04 DIAGNOSIS — O4290 Premature rupture of membranes, unspecified as to length of time between rupture and onset of labor, unspecified weeks of gestation: Secondary | ICD-10-CM | POA: Diagnosis present

## 2017-01-04 HISTORY — DX: Personal history of (healed) traumatic fracture: Z87.81

## 2017-01-04 LAB — CBC
HEMATOCRIT: 40.1 % (ref 36.0–46.0)
HEMOGLOBIN: 13.8 g/dL (ref 12.0–15.0)
MCH: 32.2 pg (ref 26.0–34.0)
MCHC: 34.4 g/dL (ref 30.0–36.0)
MCV: 93.7 fL (ref 78.0–100.0)
Platelets: 138 10*3/uL — ABNORMAL LOW (ref 150–400)
RBC: 4.28 MIL/uL (ref 3.87–5.11)
RDW: 14 % (ref 11.5–15.5)
WBC: 11.2 10*3/uL — AB (ref 4.0–10.5)

## 2017-01-04 LAB — POCT FERN TEST: POCT Fern Test: POSITIVE

## 2017-01-04 LAB — ABO/RH: ABO/RH(D): B POS

## 2017-01-04 LAB — TYPE AND SCREEN
ABO/RH(D): B POS
ANTIBODY SCREEN: NEGATIVE

## 2017-01-04 MED ORDER — SOD CITRATE-CITRIC ACID 500-334 MG/5ML PO SOLN: 30.0000 mL | ORAL | Status: DC | PRN

## 2017-01-04 MED ORDER — OXYTOCIN 40 UNITS IN LACTATED RINGERS INFUSION - SIMPLE MED
1.0000 m[IU]/min | INTRAVENOUS | Status: DC
  Administered 2017-01-04: 2 m[IU]/min via INTRAVENOUS
  Filled 2017-01-04: qty 1000

## 2017-01-04 MED ORDER — ACETAMINOPHEN 325 MG PO TABS: 650.0000 mg | ORAL_TABLET | ORAL | Status: DC | PRN

## 2017-01-04 MED ORDER — OXYCODONE-ACETAMINOPHEN 5-325 MG PO TABS: 2.0000 | ORAL_TABLET | ORAL | Status: DC | PRN

## 2017-01-04 MED ORDER — ZOLPIDEM TARTRATE 5 MG PO TABS: 5.0000 mg | ORAL_TABLET | Freq: Every evening | ORAL | Status: DC | PRN

## 2017-01-04 MED ORDER — OXYTOCIN BOLUS FROM INFUSION
500.0000 mL | Freq: Once | INTRAVENOUS | Status: AC
  Administered 2017-01-04: 500 mL via INTRAVENOUS

## 2017-01-04 MED ORDER — MISOPROSTOL 200 MCG PO TABS
ORAL_TABLET | ORAL | Status: AC
  Filled 2017-01-04: qty 2

## 2017-01-04 MED ORDER — LIDOCAINE HCL (PF) 1 % IJ SOLN
30.0000 mL | INTRAMUSCULAR | Status: DC | PRN
  Filled 2017-01-04: qty 30

## 2017-01-04 MED ORDER — PRENATAL MULTIVITAMIN CH
1.0000 | ORAL_TABLET | Freq: Every day | ORAL | Status: DC
  Administered 2017-01-05 – 2017-01-06 (×2): 1 via ORAL
  Filled 2017-01-04 (×2): qty 1

## 2017-01-04 MED ORDER — METHYLERGONOVINE MALEATE 0.2 MG/ML IJ SOLN
0.2000 mg | Freq: Once | INTRAMUSCULAR | Status: AC
  Administered 2017-01-04: 0.2 mg via INTRAMUSCULAR

## 2017-01-04 MED ORDER — LACTATED RINGERS IV SOLN: 500.0000 mL | INTRAVENOUS | Status: DC | PRN

## 2017-01-04 MED ORDER — SIMETHICONE 80 MG PO CHEW: 80.0000 mg | CHEWABLE_TABLET | ORAL | Status: DC | PRN

## 2017-01-04 MED ORDER — COCONUT OIL OIL: 1.0000 "application " | TOPICAL_OIL | Status: DC | PRN

## 2017-01-04 MED ORDER — OXYTOCIN 40 UNITS IN LACTATED RINGERS INFUSION - SIMPLE MED: 2.5000 [IU]/h | INTRAVENOUS | Status: DC

## 2017-01-04 MED ORDER — TERBUTALINE SULFATE 1 MG/ML IJ SOLN
0.2500 mg | Freq: Once | INTRAMUSCULAR | Status: DC | PRN
  Filled 2017-01-04: qty 1

## 2017-01-04 MED ORDER — BENZOCAINE-MENTHOL 20-0.5 % EX AERO: 1.0000 "application " | INHALATION_SPRAY | CUTANEOUS | Status: DC | PRN

## 2017-01-04 MED ORDER — TETANUS-DIPHTH-ACELL PERTUSSIS 5-2.5-18.5 LF-MCG/0.5 IM SUSP: 0.5000 mL | Freq: Once | INTRAMUSCULAR | Status: DC

## 2017-01-04 MED ORDER — ONDANSETRON HCL 4 MG PO TABS: 4.0000 mg | ORAL_TABLET | ORAL | Status: DC | PRN

## 2017-01-04 MED ORDER — ONDANSETRON HCL 4 MG/2ML IJ SOLN: 4.0000 mg | INTRAMUSCULAR | Status: DC | PRN

## 2017-01-04 MED ORDER — FLEET ENEMA 7-19 GM/118ML RE ENEM: 1.0000 | ENEMA | RECTAL | Status: DC | PRN

## 2017-01-04 MED ORDER — ONDANSETRON HCL 4 MG/2ML IJ SOLN: 4.0000 mg | Freq: Four times a day (QID) | INTRAMUSCULAR | Status: DC | PRN

## 2017-01-04 MED ORDER — DIBUCAINE 1 % RE OINT: 1.0000 "application " | TOPICAL_OINTMENT | RECTAL | Status: DC | PRN

## 2017-01-04 MED ORDER — MISOPROSTOL 200 MCG PO TABS
400.0000 ug | ORAL_TABLET | Freq: Once | ORAL | Status: AC
  Administered 2017-01-04: 400 ug via BUCCAL

## 2017-01-04 MED ORDER — SENNOSIDES-DOCUSATE SODIUM 8.6-50 MG PO TABS
2.0000 | ORAL_TABLET | ORAL | Status: DC
  Administered 2017-01-05 (×2): 2 via ORAL
  Filled 2017-01-04 (×2): qty 2

## 2017-01-04 MED ORDER — DIPHENHYDRAMINE HCL 25 MG PO CAPS: 25.0000 mg | ORAL_CAPSULE | Freq: Four times a day (QID) | ORAL | Status: DC | PRN

## 2017-01-04 MED ORDER — LACTATED RINGERS IV SOLN
INTRAVENOUS | Status: DC
  Administered 2017-01-04 (×2): via INTRAVENOUS

## 2017-01-04 MED ORDER — OXYCODONE-ACETAMINOPHEN 5-325 MG PO TABS
1.0000 | ORAL_TABLET | ORAL | Status: DC | PRN
  Administered 2017-01-04: 1 via ORAL
  Filled 2017-01-04: qty 1

## 2017-01-04 MED ORDER — WITCH HAZEL-GLYCERIN EX PADS: 1.0000 "application " | MEDICATED_PAD | CUTANEOUS | Status: DC | PRN

## 2017-01-04 MED ORDER — IBUPROFEN 600 MG PO TABS
600.0000 mg | ORAL_TABLET | Freq: Four times a day (QID) | ORAL | Status: DC
  Administered 2017-01-05 – 2017-01-06 (×7): 600 mg via ORAL
  Filled 2017-01-04 (×7): qty 1

## 2017-01-04 NOTE — MAU Note (Signed)
Water broke around 0830, clear fluid. Still coming  Small amt of blood noted. No pain.

## 2017-01-04 NOTE — Anesthesia Pain Management Evaluation Note (Signed)
  CRNA Pain Management Visit Note  Patient: Jill [name], 36 y.o., female  "Hello I am a member of the anesthesia team at [facility]. We have an anesthesia team available at all times to provide care throughout the hospital, including epidural management and anesthesia for C-section. I don't know your plan for the delivery whether it a natural birth, water birth, IV sedation, nitrous supplementation, doula or epidural, but we want to meet your pain goals."   1.Was your pain managed to your expectations on prior hospitalizations?   Yes   2.What is your expectation for pain management during this hospitalization?     Nitrous Oxide  3.How can we help you reach that goal? Nitrous oxide  Record the patient's initial score and the patient's pain goal.   Pain: 0  Pain Goal: 8 [facility] wants you to be able to say your pain was always managed very well.  Madison Hickman 01/04/2017

## 2017-01-04 NOTE — MAU Note (Signed)
Clear fluid noted on floor and on gown

## 2017-01-04 NOTE — Progress Notes (Signed)
Bee Ronnika Collett is a 36 y.o. G3P2002 at [redacted]w[redacted]d active labor  Subjective: Patient comfortable. Feeling more contractions.  Objective: BP 109/67   Pulse 80   Temp 98.5 F (36.9 C) (Oral)   Resp 18   Ht  (1.549 m)   Wt 145 lb 8 oz (66 kg)   LMP 04/11/2016 (Exact Date)   SpO2 100%   BMI 27.49 kg/m    FHT:  FHR: 140s bpm, variability: moderate,  accelerations:  Present,  decelerations:  Absent UC:   regular, every 3-4 minutes SVE:   Dilation: 2 Effacement (%): 50 Station: -2, -3 Exam by:: Milus Glazier, RN Pitocin at 52mu/min   Assessment / Plan: IUP at term. Spontaneous onset of labor.  Plan: Continue pitocin. Will recheck with increased pressure.   Cleone Slim 01/04/2017, 3:04 PM

## 2017-01-04 NOTE — Progress Notes (Signed)
Subjective: Patient comfortable. Coping well with contractions.  Objective: BP 105/73 (BP Location: Right Arm)   Pulse 85   Temp 98.5 F (36.9 C) (Oral)   Resp 18   Ht  (1.549 m)   Wt 145 lb 8 oz (66 kg)   LMP 04/11/2016 (Exact Date)   SpO2 100%   BMI 27.49 kg/m   FHT:  FHR: 140 bpm, variability: moderate,  accelerations:  Present,  decelerations:  Absent UC:   regular, every 1-5 minutes SVE:   Dilation: 3 Effacement (%): 70 Station: -2, -3 Exam by:: Wynelle Cleveland, RN Pitocin at 11mu/min  Assessment / Plan: IUP at term. Spontaneous onset of labor.   Plan: continue current plan of care  Cleone Slim 01/04/2017, 6:11 PM
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
## 2017-01-05 LAB — CBC
HCT: 36.8 % (ref 36.0–46.0)
HEMOGLOBIN: 12.7 g/dL (ref 12.0–15.0)
MCH: 32 pg (ref 26.0–34.0)
MCHC: 34.5 g/dL (ref 30.0–36.0)
MCV: 92.7 fL (ref 78.0–100.0)
PLATELETS: 121 10*3/uL — AB (ref 150–400)
RBC: 3.97 MIL/uL (ref 3.87–5.11)
RDW: 14 % (ref 11.5–15.5)
WBC: 17.6 10*3/uL — ABNORMAL HIGH (ref 4.0–10.5)

## 2017-01-05 LAB — RPR: RPR: NONREACTIVE

## 2017-01-05 NOTE — Progress Notes (Signed)
UR chart review completed.  

## 2017-01-05 NOTE — Progress Notes (Signed)
Post Partum Day 1 Subjective: no complaints, up ad lib, voiding and tolerating PO  Objective: Blood pressure 94/63, pulse 83, temperature 98.2 F (36.8 C), resp. rate 20, height  (1.549 m), weight 145 lb 8 oz (66 kg), last menstrual period 04/11/2016, SpO2 99 %, unknown if currently breastfeeding.  Physical Exam:  General: alert, cooperative, appears stated age and no distress Lochia: appropriate Uterine Fundus: firm Incision: n/a DVT Evaluation: No evidence of DVT seen on physical exam.   Recent Labs  01/04/17 1205  HGB 13.8  HCT 40.1    Assessment/Plan: Plan for discharge tomorrow   LOS: 1 day   Wyvonnia Dusky 01/05/2017, 5:04 AM

## 2017-01-05 NOTE — Lactation Note (Signed)
This note was copied from a baby's chart. Lactation Consultation Note  Patient Name: Jill [name] [id number] Date: 01/05/2017 Reason for consult: Follow-up assessment   Follow-up consult at 25 hrs old; GA 38.2; BW 8 lbs, 4.8 oz.  Mom speaks [name] [place] (female family member in room who can interpret).  Mom is P3 with experience breastfeeding previous children for 2 years.   Infant has breastfed x8 (12-35 min) + attempts x3 (0-8 min); voids-3; stools-2; LS-8 by RN.   Mom was feeding infant when [facility] entered room.  Mom leaned over infant lying on pillow in mom's lap feeding with minimal breast support and minimal positional support.  Infant had shallow latch on left breast.   When LC asked if mom's nipples hurt, she stated they hurt some but not bad.  LC explained that infant needs more depth on breast and encouraged mom to tug on infant's chin to get mouth wider.   LC attempted to adjust mom's hold and positioned pillows for comfort but mom kept going back to her original position.  LC explained infant's mouth needed to be wide like a duck with infant's body facing mom's body for proper swallowing and transfer.   Female family member understood and was attempting to help mom flange baby's lips. Encouraged to call for assistance if needed.       Maternal Data Does the patient have breastfeeding experience prior to this delivery?: Yes (easily expresses colostrum)  Feeding Feeding Type: Breast Fed  LATCH Score/Interventions Latch: Grasps breast easily, tongue down, lips flanged, rhythmical sucking.  Audible Swallowing: A few with stimulation  Type of Nipple: Everted at rest and after stimulation  Comfort (Breast/Nipple): Filling, red/small blisters or bruises, mild/mod discomfort (mom stated stated some minor pain; LC encouraged more depth)  Problem noted: Mild/Moderate discomfort  Hold (Positioning): Assistance needed to correctly position infant at breast and maintain  latch. Intervention(s): Support Pillows;Breastfeeding basics reviewed;Skin to skin  LATCH Score: 7   Consult Status Consult Status: Follow-up Date: 01/06/17 Follow-up type: In-patient    Lendon Ka 01/05/2017, 9:28 PM

## 2017-01-05 NOTE — Progress Notes (Signed)
Post Partum Day #1 Subjective: no complaints, up ad lib, voiding, tolerating PO and reports that baby is doing well feeding, Michaelene Song used via Runner, broadcasting/film/video.  Objective: Blood pressure 94/63, pulse 83, temperature 98.2 F (36.8 C), resp. rate 20, height  (1.549 m), weight 145 lb 8 oz (66 kg), last menstrual period 04/11/2016, SpO2 99 %, unknown if currently breastfeeding.  Physical Exam:  General: alert, cooperative and no distress Lochia: appropriate Uterine Fundus: firm Incision: none DVT Evaluation: No evidence of DVT seen on physical exam. No cords or calf tenderness. No significant calf/ankle edema.   Recent Labs  01/04/17 1205 01/05/17 0513  HGB 13.8 12.7  HCT 40.1 36.8    Assessment/Plan: Plan for discharge tomorrow, Breastfeeding and Contraception POP planned.   LOS: 1 day   Roe Coombs, CNM 01/05/2017, 8:11 AM
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
## 2017-01-06 MED ORDER — SENNOSIDES-DOCUSATE SODIUM 8.6-50 MG PO TABS: 2.0000 | ORAL_TABLET | Freq: Every day | ORAL | 2 refills | Status: DC

## 2017-01-06 MED ORDER — IBUPROFEN 600 MG PO TABS: 600.0000 mg | ORAL_TABLET | Freq: Four times a day (QID) | ORAL | 2 refills | Status: DC

## 2017-01-06 NOTE — Discharge Summary (Signed)
OB Discharge Summary     Patient Name: Jill [name] DOB: 1981-02-14 MRN: [id number]  Date of admission: 01/04/2017 Delivering MD: Rolm Bookbinder   Date of discharge: 01/06/2017  Admitting diagnosis: 37wks water broke  Intrauterine pregnancy: [redacted]w[redacted]d     Secondary diagnosis:  Active Problems:   PROM (premature rupture of membranes)  Additional problems: none     Discharge diagnosis: Term Pregnancy Delivered                                                                                                Post partum procedures:none  Augmentation: Pitocin  Complications: None  Hospital course:  Onset of Labor With Vaginal Delivery     36 y.o. yo G3P3003 at [redacted]w[redacted]d was admitted in Latent Labor on 01/04/2017. Patient had an uncomplicated labor course as follows:  Membrane Rupture Time/Date: 8:30 AM ,01/04/2017   Intrapartum Procedures: Episiotomy: None [1]                                         Lacerations:  None [1]  Patient had a delivery of a Viable infant. 01/04/2017  Information for the patient's newborn:  Irean, Kendricks Girl Kyan Giannone [[id number]]  Delivery Method: Vag-Spont    Pateint had an uncomplicated postpartum course.  She is ambulating, tolerating a regular diet, passing flatus, and urinating well. Patient is discharged home in stable condition on 01/06/17.   Physical exam  Vitals:   01/05/17 0000 01/05/17 0400 01/05/17 1010 01/05/17 1743  BP: 103/66 94/63 (!) 87/54 106/66  Pulse: 81 83 87 (!) 111  Resp: Temp: 98.4 F (36.9 C) 98.2 F (36.8 C) 98.4 F (36.9 C) 97.7 F (36.5 C)  TempSrc: Oral  Oral Oral  SpO2: 99%     Weight:      Height:       General: alert, cooperative and no distress Lochia: appropriate Uterine Fundus: firm Incision: N/A DVT Evaluation: No evidence of DVT seen on physical exam. No cords or calf tenderness. No significant calf/ankle edema. Labs: Lab Results  Component Value Date   WBC 17.6 (H) 01/05/2017   HGB 12.7 01/05/2017   HCT 36.8 01/05/2017   MCV 92.7 01/05/2017   PLT 121 (L) 01/05/2017   CMP Latest Ref Rng & Units 01/21/2015  Glucose 70 - 99 mg/dL 86  BUN 6 - 20 mg/dL 8  Creatinine 7.82 - 9.56 mg/dL 2.13  Sodium 086 - 578 mmol/L 137  Potassium 3.5 - 5.1 mmol/L 3.8  Chloride 101 - 111 mmol/L 108  CO2 22 - 32 mmol/L 20(L)  Calcium 8.9 - 10.3 mg/dL 8.1(L)  Total Protein 6.5 - 8.1 g/dL 4.6(N)  Total Bilirubin 0.3 - 1.2 mg/dL 1.0  Alkaline Phos 38 - 126 U/L 32(L)  AST 15 - 41 U/L 109(H)  ALT 14 - 54 U/L 132(H)    Discharge instruction: per After Visit Summary and "Baby and Me Booklet".  After visit meds:  Allergies as of 01/06/2017  No Known Allergies     Medication List    TAKE these medications   ibuprofen 600 MG tablet Commonly known as:  ADVIL,MOTRIN Take 1 tablet (600 mg total) by mouth every 6 (six) hours.   PRENATE PIXIE 10-0.6-0.4-200 MG Caps Take 1 capsule by mouth daily.   senna-docusate 8.6-50 MG tablet Commonly known as:  Senokot-S Take 2 tablets by mouth at bedtime.       Diet: routine diet  Activity: Advance as tolerated. Pelvic rest for 6 weeks.   Outpatient follow up:6 weeks Follow up Appt:No future appointments. Follow up Visit:No Follow-up on file.  Postpartum contraception: Progesterone only pills  Newborn Data: Live born female  Birth Weight: 8 lb 4.8 oz (3765 g) APGAR: 7, 9  Baby Feeding: Breast Disposition:home with mother   01/06/2017 Roe Coombs, CNM

## 2017-01-06 NOTE — Discharge Instructions (Signed)

## 2017-01-06 NOTE — Progress Notes (Signed)
Post Partum Day #2 Subjective: no complaints, up ad lib, voiding, tolerating PO and + flatus  Objective: Blood pressure 106/66, pulse (!) 111, temperature 97.7 F (36.5 C), temperature source Oral, resp. rate 18, height  (1.549 m), weight 145 lb 8 oz (66 kg), last menstrual period 04/11/2016, SpO2 99 %, unknown if currently breastfeeding.  Physical Exam:  General: alert, cooperative and no distress Lochia: appropriate Uterine Fundus: firm Incision: n/a DVT Evaluation: No evidence of DVT seen on physical exam. No cords or calf tenderness. No significant calf/ankle edema.   Recent Labs  01/04/17 1205 01/05/17 0513  HGB 13.8 12.7  HCT 40.1 36.8    Assessment/Plan: Discharge home, Breastfeeding and Contraception POP   LOS: 2 days   Roe Coombs, CNM  01/06/2017, 8:27 AM

## 2017-01-06 NOTE — Lactation Note (Signed)
This note was copied from a baby's chart. Lactation Consultation Note: Mother speaks [place]. Mothers niece in room to interpret . Reviewed cluster feeding, cue base feeding and feeding infant at least 8-12 times in 24 hours. Advised mother to breast feed infant well when her milk is coming in to prevent engorgement. Suggested that mother can use ice to decrease swelling. Mother informed of LC services and community support. Mother denies having any questions or concerns.  Patient Name: Jill [name] [id number] Date: 01/06/2017 Reason for consult: Follow-up assessment   Maternal Data    Feeding Feeding Type: Breast Fed Length of feed: 10 min  LATCH Score/Interventions                      Lactation Tools Discussed/Used     Consult Status Consult Status: Complete    Michel Bickers 01/06/2017, 9:33 AM

## 2017-01-08 ENCOUNTER — Encounter: Payer: Medicaid Other | Admitting: Certified Nurse Midwife

## 2017-02-11 ENCOUNTER — Encounter: Payer: Self-pay | Admitting: Certified Nurse Midwife

## 2017-02-11 ENCOUNTER — Ambulatory Visit (INDEPENDENT_AMBULATORY_CARE_PROVIDER_SITE_OTHER): Payer: Medicaid Other | Admitting: Certified Nurse Midwife

## 2017-02-11 DIAGNOSIS — Z30011 Encounter for initial prescription of contraceptive pills: Secondary | ICD-10-CM

## 2017-02-11 MED ORDER — NORETHINDRONE 0.35 MG PO TABS: 1.0000 | ORAL_TABLET | Freq: Every day | ORAL | 11 refills | Status: DC

## 2017-02-11 NOTE — Progress Notes (Signed)
Post Partum Exam  [name] is a 36 y.o. 143P3003 female who presents for a postpartum visit. She is 4 weeks postpartum following a spontaneous vaginal delivery. I have fully reviewed the prenatal and intrapartum course. The delivery was at 38.3 gestational weeks.  Anesthesia: none. Postpartum course has been doing well. Baby's course has been doing well. Baby is feeding by both breast and bottle - Similac Advance. Bleeding no bleeding. Bowel function is normal. Bladder function is normal. Patient is not sexually active. Contraception method is abstinence. [facility]. Postpartum depression screening: Score 6. Pt states she feels overwhelmed at home.

## 2017-02-11 NOTE — Progress Notes (Signed)
Post Partum Exam  Jill [name] is a 36 y.o. 43P3003 female who presents for a postpartum visit. She is 4 weeks postpartum following a spontaneous vaginal delivery. I have fully reviewed the prenatal and intrapartum course. The delivery was at 38.3 gestational weeks.  Anesthesia: none. Postpartum course has been doing well. Baby's course has been doing well. Baby is feeding by both breast and bottle - Similac Advance. Bleeding no bleeding. Bleeding stopped bleeding 02/10/2016.Bowel function is normal. Bladder function is normal. Patient is not sexually active. Contraception method is abstinence. [facility]. Postpartum depression screening: Score 6. Pt states she feels overwhelmed at home. Patient has niece present today and niece states she will help with it.   The following portions of the patient's history were reviewed and updated as appropriate: past social history, past surgical history and problem list.  Review of Systems Pertinent items noted in HPI and remainder of comprehensive ROS otherwise negative.    Objective:  unknown if currently breastfeeding.  General:  alert, cooperative, fatigued and no distress   Breasts:  inspection negative, no nipple discharge or bleeding, no masses or nodularity palpable  Lungs: clear to auscultation bilaterally  Heart:  regular rate and rhythm, S1, S2 normal, no murmur, click, rub or gallop  Abdomen: soft, non-tender; bowel sounds normal; no masses,  no organomegaly   Vulva:  normal  Vagina: normal  Cervix:  Not evaluated  Corpus: normal  Adnexa:  normal adnexa  Rectal Exam: Not performed.        Assessment:    4 week  postpartum exam. Pap smear not done at today's visit.   Plan:   1. Contraception: oral progesterone-only contraceptive 2. Follow up in: 7 months or as needed for annual exam and contraception surveillance.

## 2018-02-03 ENCOUNTER — Other Ambulatory Visit (HOSPITAL_COMMUNITY)
Admission: RE | Admit: 2018-02-03 | Discharge: 2018-02-03 | Disposition: A | Discharge status: Home / Self Care | Payer: Medicaid Other | Source: Ambulatory Visit | Attending: Certified Nurse Midwife | Admitting: Certified Nurse Midwife

## 2018-02-03 ENCOUNTER — Encounter: Payer: Self-pay | Admitting: Certified Nurse Midwife

## 2018-02-03 ENCOUNTER — Ambulatory Visit (INDEPENDENT_AMBULATORY_CARE_PROVIDER_SITE_OTHER): Payer: Medicaid Other | Admitting: Certified Nurse Midwife

## 2018-02-03 VITALS — BP 122/80 | HR 69 | Wt 110.6 lb

## 2018-02-03 DIAGNOSIS — Z01419 Encounter for gynecological examination (general) (routine) without abnormal findings: Secondary | ICD-10-CM | POA: Insufficient documentation

## 2018-02-03 DIAGNOSIS — Z0000 Encounter for general adult medical examination without abnormal findings: Secondary | ICD-10-CM

## 2018-02-03 DIAGNOSIS — Z3009 Encounter for other general counseling and advice on contraception: Secondary | ICD-10-CM

## 2018-02-03 NOTE — Progress Notes (Signed)
Patient is in the office for annual, and to discuss [facility] options. Pt is considering nexplanon
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
## 2018-02-05 ENCOUNTER — Other Ambulatory Visit: Payer: Self-pay | Admitting: Certified Nurse Midwife

## 2018-02-05 LAB — CYTOLOGY - PAP
Diagnosis: NEGATIVE
HPV: NOT DETECTED

## 2018-02-17 ENCOUNTER — Ambulatory Visit: Payer: Medicaid Other | Admitting: Certified Nurse Midwife

## 2018-02-25 ENCOUNTER — Encounter: Payer: Self-pay | Admitting: Certified Nurse Midwife

## 2018-02-25 ENCOUNTER — Ambulatory Visit (INDEPENDENT_AMBULATORY_CARE_PROVIDER_SITE_OTHER): Payer: Medicaid Other | Admitting: Certified Nurse Midwife

## 2018-02-25 ENCOUNTER — Encounter: Payer: Self-pay | Admitting: *Deleted

## 2018-02-25 VITALS — BP 108/73 | HR 76 | Wt 114.4 lb

## 2018-02-25 DIAGNOSIS — Z3046 Encounter for surveillance of implantable subdermal contraceptive: Secondary | ICD-10-CM

## 2018-02-25 DIAGNOSIS — Z789 Other specified health status: Secondary | ICD-10-CM

## 2018-02-25 DIAGNOSIS — Z30017 Encounter for initial prescription of implantable subdermal contraceptive: Secondary | ICD-10-CM | POA: Insufficient documentation

## 2018-02-25 DIAGNOSIS — Z3202 Encounter for pregnancy test, result negative: Secondary | ICD-10-CM

## 2018-02-25 LAB — POCT URINE PREGNANCY: PREG TEST UR: NEGATIVE

## 2018-02-25 MED ORDER — ETONOGESTREL 68 MG ~~LOC~~ IMPL
68.0000 mg | DRUG_IMPLANT | Freq: Once | SUBCUTANEOUS | Status: AC
  Administered 2018-02-25: 68 mg via SUBCUTANEOUS

## 2018-02-25 NOTE — Progress Notes (Signed)
Patient is in the office for nexplanon insertion. 

## 2018-02-25 NOTE — Progress Notes (Signed)
Nexplanon Procedure Note   PRE-OP DIAGNOSIS: desired long-term, reversible contraception  POST-OP DIAGNOSIS: Same  PROCEDURE: Nexplanon  placement Performing Provider: Orvilla Cornwallachelle Abran Gavigan CNM   Patient education prior to procedure, explained risk, benefits of Nexplanon, reviewed alternative options. Patient reported understanding. Gave consent to continue with procedure.   PROCEDURE:  Pregnancy Text :  Negative Site (check):      left arm         Sterile Preparation:   Betadinex3 Lot # [id number]  [id number] Expiration Date 2021OCT10  Insertion site was selected 8 - 10 cm from medial epicondyle and marked along with guiding site using sterile marker. Procedure area was prepped and draped in a sterile fashion. 1% Lidocaine 1.5 ml given prior to procedure. Nexplanon  was inserted subcutaneously.Needle was removed from the insertion site. Nexplanon capsule was palpated by provider and patient to assure satisfactory placement. And a bandage applied and the arm was wrapped with gauze bandage.     Followup: The patient tolerated the procedure well without complications.  Instructions:  The patient was instructed to remove the dressing in 24 hours and that some bruising is to be expected.  She was advised to use over the counter analgesics as needed for any pain at the site.  She is to keep the area dry for 24 hours and to call if her hand or arm becomes cold, numb, or blue.   Orvilla Cornwallachelle Danica Camarena CNM

## 2019-09-28 ENCOUNTER — Ambulatory Visit: Payer: Medicaid Other

## 2019-09-28 ENCOUNTER — Other Ambulatory Visit: Payer: Self-pay

## 2019-09-28 VITALS — BP 121/75 | HR 78 | Wt 127.0 lb

## 2019-09-28 DIAGNOSIS — Z3046 Encounter for surveillance of implantable subdermal contraceptive: Secondary | ICD-10-CM | POA: Insufficient documentation

## 2019-09-28 DIAGNOSIS — Z789 Other specified health status: Secondary | ICD-10-CM

## 2019-09-28 DIAGNOSIS — N939 Abnormal uterine and vaginal bleeding, unspecified: Secondary | ICD-10-CM | POA: Insufficient documentation

## 2019-09-28 DIAGNOSIS — Z319 Encounter for procreative management, unspecified: Secondary | ICD-10-CM | POA: Diagnosis not present

## 2019-09-28 DIAGNOSIS — Z30432 Encounter for removal of intrauterine contraceptive device: Secondary | ICD-10-CM

## 2019-09-28 MED ORDER — PREPLUS 27-1 MG PO TABS: 1.0000 | ORAL_TABLET | Freq: Once | ORAL | 3 refills | Status: AC

## 2019-09-28 NOTE — Progress Notes (Signed)
Pt presents for nexplanon removal due to irregular bleeding.   Pt does not want any other birth control at this time.
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
## 2020-05-10 ENCOUNTER — Other Ambulatory Visit: Payer: Self-pay

## 2020-05-10 ENCOUNTER — Ambulatory Visit (INDEPENDENT_AMBULATORY_CARE_PROVIDER_SITE_OTHER): Payer: Medicaid Other

## 2020-05-10 VITALS — BP 110/74 | HR 84 | Ht 61.0 in | Wt 127.0 lb

## 2020-05-10 DIAGNOSIS — Z3A01 Less than 8 weeks gestation of pregnancy: Secondary | ICD-10-CM

## 2020-05-10 DIAGNOSIS — Z3480 Encounter for supervision of other normal pregnancy, unspecified trimester: Secondary | ICD-10-CM | POA: Insufficient documentation

## 2020-05-10 LAB — POCT URINE PREGNANCY: Preg Test, Ur: POSITIVE — AB

## 2020-05-10 MED ORDER — BLOOD PRESSURE KIT DEVI: 1.0000 | 0 refills | Status: DC

## 2020-05-10 NOTE — Progress Notes (Signed)
[name] Interpreter [place] [phone] Jill [name] presents today for UPT. She has no unusual complaints.  LMP: 03/29/2020 EDD: 01/03/2021    OBJECTIVE: Appears well, in no apparent distress.  OB History    Gravida  4   Para  3   Term  3   Preterm      AB      Living  3     SAB      TAB      Ectopic      Multiple  0   Live Births  3        Obstetric Comments  Vag deliveries in [place], no pain medication, uncomplicated per pt.       Home UPT Result: POSITIVE In-Office UPT result: POSITIVE  I have reviewed the patient's medical, obstetrical, social, and family histories, and medications.   ASSESSMENT: Positive pregnancy test  PLAN Prenatal care to be completed at: [facility]  --------------------------------------------------------  PRENATAL INTAKE SUMMARY  Jill [name] presents today New OB Nurse Interview.  OB History    Gravida  4   Para  3   Term  3   Preterm      AB      Living  3     SAB      TAB      Ectopic      Multiple  0   Live Births  3        Obstetric Comments  Vag deliveries in [place], no pain medication, uncomplicated per pt.       I have reviewed the patient's medical, obstetrical, social, and family histories, medications, and available lab results.  SUBJECTIVE She has no unusual complaints  OBJECTIVE Initial NURSE INTAKE (New OB)  GENERAL APPEARANCE: alert, well appearing   ASSESSMENT Normal pregnancy LMP  03/29/2020 EDD  01/03/2021 [redacted]w[redacted]d  PLAN Prenatal care OB Pnl will be done at NOB visit. BP Cuff ordered, patient will pick up and take to NOB visit for instructions on on how to use.

## 2020-05-10 NOTE — Progress Notes (Signed)
Patient was assessed and managed by nursing staff during this encounter. I have reviewed the chart and agree with the documentation and plan. I have also made any necessary editorial changes.  Catalina Antigua, MD 05/10/2020 9:59 AM

## 2020-06-08 ENCOUNTER — Other Ambulatory Visit (HOSPITAL_COMMUNITY)
Admission: RE | Admit: 2020-06-08 | Discharge: 2020-06-08 | Disposition: A | Discharge status: Home / Self Care | Payer: Medicaid Other | Source: Ambulatory Visit | Attending: Obstetrics and Gynecology | Admitting: Obstetrics and Gynecology

## 2020-06-08 ENCOUNTER — Ambulatory Visit (INDEPENDENT_AMBULATORY_CARE_PROVIDER_SITE_OTHER): Payer: Medicaid Other | Admitting: Obstetrics and Gynecology

## 2020-06-08 ENCOUNTER — Encounter: Payer: Self-pay | Admitting: Obstetrics and Gynecology

## 2020-06-08 ENCOUNTER — Other Ambulatory Visit: Payer: Self-pay

## 2020-06-08 VITALS — BP 112/76 | HR 98 | Wt 132.0 lb

## 2020-06-08 DIAGNOSIS — Z31430 Encounter of female for testing for genetic disease carrier status for procreative management: Secondary | ICD-10-CM | POA: Diagnosis not present

## 2020-06-08 DIAGNOSIS — O09529 Supervision of elderly multigravida, unspecified trimester: Secondary | ICD-10-CM | POA: Insufficient documentation

## 2020-06-08 DIAGNOSIS — Z3480 Encounter for supervision of other normal pregnancy, unspecified trimester: Secondary | ICD-10-CM | POA: Insufficient documentation

## 2020-06-08 DIAGNOSIS — O09521 Supervision of elderly multigravida, first trimester: Secondary | ICD-10-CM

## 2020-06-08 MED ORDER — PREPLUS 27-1 MG PO TABS: 1.0000 | ORAL_TABLET | Freq: Every day | ORAL | 13 refills | Status: DC

## 2020-06-08 MED ORDER — PROMETHAZINE HCL 25 MG PO TABS: 25.0000 mg | ORAL_TABLET | Freq: Four times a day (QID) | ORAL | 2 refills | Status: DC | PRN

## 2020-06-08 NOTE — Patient Instructions (Signed)
 First Trimester of Pregnancy The first trimester of pregnancy is from week 1 until the end of week 13 (months 1 through 3). A week after a sperm fertilizes an egg, the egg will implant on the wall of the uterus. This embryo will begin to develop into a baby. Genes from you and your partner will form the baby. The female genes will determine whether the baby will be a boy or a girl. At 6-8 weeks, the eyes and face will be formed, and the heartbeat can be seen on ultrasound. At the end of 12 weeks, all the baby's organs will be formed. Now that you are pregnant, you will want to do everything you can to have a healthy baby. Two of the most important things are to get good prenatal care and to follow your health care provider's instructions. Prenatal care is all the medical care you receive before the baby's birth. This care will help prevent, find, and treat any problems during the pregnancy and childbirth. Body changes during your first trimester Your body goes through many changes during pregnancy. The changes vary from woman to woman.  You may gain or lose a couple of pounds at first.  You may feel sick to your stomach (nauseous) and you may throw up (vomit). If the vomiting is uncontrollable, call your health care provider.  You may tire easily.  You may develop headaches that can be relieved by medicines. All medicines should be approved by your health care provider.  You may urinate more often. Painful urination may mean you have a bladder infection.  You may develop heartburn as a result of your pregnancy.  You may develop constipation because certain hormones are causing the muscles that push stool through your intestines to slow down.  You may develop hemorrhoids or swollen veins (varicose veins).  Your breasts may begin to grow larger and become tender. Your nipples may stick out more, and the tissue that surrounds them (areola) may become darker.  Your gums may bleed and may be  sensitive to brushing and flossing.  Dark spots or blotches (chloasma, mask of pregnancy) may develop on your face. This will likely fade after the baby is born.  Your menstrual periods will stop.  You may have a loss of appetite.  You may develop cravings for certain kinds of food.  You may have changes in your emotions from day to day, such as being excited to be pregnant or being concerned that something may go wrong with the pregnancy and baby.  You may have more vivid and strange dreams.  You may have changes in your hair. These can include thickening of your hair, rapid growth, and changes in texture. Some women also have hair loss during or after pregnancy, or hair that feels dry or thin. Your hair will most likely return to normal after your baby is born. What to expect at prenatal visits During a routine prenatal visit:  You will be weighed to make sure you and the baby are growing normally.  Your blood pressure will be taken.  Your abdomen will be measured to track your baby's growth.  The fetal heartbeat will be listened to between weeks 10 and 14 of your pregnancy.  Test results from any previous visits will be discussed. Your health care provider may ask you:  How you are feeling.  If you are feeling the baby move.  If you have had any abnormal symptoms, such as leaking fluid, bleeding, severe headaches, or   abdominal cramping.  If you are using any tobacco products, including cigarettes, chewing tobacco, and electronic cigarettes.  If you have any questions. Other tests that may be performed during your first trimester include:  Blood tests to find your blood type and to check for the presence of any previous infections. The tests will also be used to check for low iron levels (anemia) and protein on red blood cells (Rh antibodies). Depending on your risk factors, or if you previously had diabetes during pregnancy, you may have tests to check for high blood sugar  that affects pregnant women (gestational diabetes).  Urine tests to check for infections, diabetes, or protein in the urine.  An ultrasound to confirm the proper growth and development of the baby.  Fetal screens for spinal cord problems (spina bifida) and Down syndrome.  HIV (human immunodeficiency virus) testing. Routine prenatal testing includes screening for HIV, unless you choose not to have this test.  You may need other tests to make sure you and the baby are doing well. Follow these instructions at home: Medicines  Follow your health care provider's instructions regarding medicine use. Specific medicines may be either safe or unsafe to take during pregnancy.  Take a prenatal vitamin that contains at least 600 micrograms (mcg) of folic acid.  If you develop constipation, try taking a stool softener if your health care provider approves. Eating and drinking   Eat a balanced diet that includes fresh fruits and vegetables, whole grains, good sources of protein such as meat, eggs, or tofu, and low-fat dairy. Your health care provider will help you determine the amount of weight gain that is right for you.  Avoid raw meat and uncooked cheese. These carry germs that can cause birth defects in the baby.  Eating four or five small meals rather than three large meals a day may help relieve nausea and vomiting. If you start to feel nauseous, eating a few soda crackers can be helpful. Drinking liquids between meals, instead of during meals, also seems to help ease nausea and vomiting.  Limit foods that are high in fat and processed sugars, such as fried and sweet foods.  To prevent constipation: ? Eat foods that are high in fiber, such as fresh fruits and vegetables, whole grains, and beans. ? Drink enough fluid to keep your urine clear or pale yellow. Activity  Exercise only as directed by your health care provider. Most women can continue their usual exercise routine during  pregnancy. Try to exercise for 30 minutes at least 5 days a week. Exercising will help you: ? Control your weight. ? Stay in shape. ? Be prepared for labor and delivery.  Experiencing pain or cramping in the lower abdomen or lower back is a good sign that you should stop exercising. Check with your health care provider before continuing with normal exercises.  Try to avoid standing for long periods of time. Move your legs often if you must stand in one place for a long time.  Avoid heavy lifting.  Wear low-heeled shoes and practice good posture.  You may continue to have sex unless your health care provider tells you not to. Relieving pain and discomfort  Wear a good support bra to relieve breast tenderness.  Take warm sitz baths to soothe any pain or discomfort caused by hemorrhoids. Use hemorrhoid cream if your health care provider approves.  Rest with your legs elevated if you have leg cramps or low back pain.  If you develop varicose veins   in your legs, wear support hose. Elevate your feet for 15 minutes, 3-4 times a day. Limit salt in your diet. Prenatal care  Schedule your prenatal visits by the twelfth week of pregnancy. They are usually scheduled monthly at first, then more often in the last 2 months before delivery.  Write down your questions. Take them to your prenatal visits.  Keep all your prenatal visits as told by your health care provider. This is important. Safety  Wear your seat belt at all times when driving.  Make a list of emergency phone numbers, including numbers for family, friends, the hospital, and police and fire departments. General instructions  Ask your health care provider for a referral to a local prenatal education class. Begin classes no later than the beginning of month 6 of your pregnancy.  Ask for help if you have counseling or nutritional needs during pregnancy. Your health care provider can offer advice or refer you to specialists for help  with various needs.  Do not use hot tubs, steam rooms, or saunas.  Do not douche or use tampons or scented sanitary pads.  Do not cross your legs for long periods of time.  Avoid cat litter boxes and soil used by cats. These carry germs that can cause birth defects in the baby and possibly loss of the fetus by miscarriage or stillbirth.  Avoid all smoking, herbs, alcohol, and medicines not prescribed by your health care provider. Chemicals in these products affect the formation and growth of the baby.  Do not use any products that contain nicotine or tobacco, such as cigarettes and e-cigarettes. If you need help quitting, ask your health care provider. You may receive counseling support and other resources to help you quit.  Schedule a dentist appointment. At home, brush your teeth with a soft toothbrush and be gentle when you floss. Contact a health care provider if:  You have dizziness.  You have mild pelvic cramps, pelvic pressure, or nagging pain in the abdominal area.  You have persistent nausea, vomiting, or diarrhea.  You have a bad smelling vaginal discharge.  You have pain when you urinate.  You notice increased swelling in your face, hands, legs, or ankles.  You are exposed to fifth disease or chickenpox.  You are exposed to German measles (rubella) and have never had it. Get help right away if:  You have a fever.  You are leaking fluid from your vagina.  You have spotting or bleeding from your vagina.  You have severe abdominal cramping or pain.  You have rapid weight gain or loss.  You vomit blood or material that looks like coffee grounds.  You develop a severe headache.  You have shortness of breath.  You have any kind of trauma, such as from a fall or a car accident. Summary  The first trimester of pregnancy is from week 1 until the end of week 13 (months 1 through 3).  Your body goes through many changes during pregnancy. The changes vary from  woman to woman.  You will have routine prenatal visits. During those visits, your health care provider will examine you, discuss any test results you may have, and talk with you about how you are feeling. This information is not intended to replace advice given to you by your health care provider. Make sure you discuss any questions you have with your health care provider. Document Revised: 08/14/2017 Document Reviewed: 08/13/2016 [employer] Patient Education  2020 [employer]   Second Trimester of   Pregnancy The second trimester is from week 14 through week 27 (months 4 through 6). The second trimester is often a time when you feel your best. Your body has adjusted to being pregnant, and you begin to feel better physically. Usually, morning sickness has lessened or quit completely, you may have more energy, and you may have an increase in appetite. The second trimester is also a time when the fetus is growing rapidly. At the end of the sixth month, the fetus is about 9 inches long and weighs about 1 pounds. You will likely begin to feel the baby move (quickening) between 16 and 20 weeks of pregnancy. Body changes during your second trimester Your body continues to go through many changes during your second trimester. The changes vary from woman to woman.  Your weight will continue to increase. You will notice your lower abdomen bulging out.  You may begin to get stretch marks on your hips, abdomen, and breasts.  You may develop headaches that can be relieved by medicines. The medicines should be approved by your health care provider.  You may urinate more often because the fetus is pressing on your bladder.  You may develop or continue to have heartburn as a result of your pregnancy.  You may develop constipation because certain hormones are causing the muscles that push waste through your intestines to slow down.  You may develop hemorrhoids or swollen, bulging veins (varicose  veins).  You may have back pain. This is caused by: ? Weight gain. ? Pregnancy hormones that are relaxing the joints in your pelvis. ? A shift in weight and the muscles that support your balance.  Your breasts will continue to grow and they will continue to become tender.  Your gums may bleed and may be sensitive to brushing and flossing.  Dark spots or blotches (chloasma, mask of pregnancy) may develop on your face. This will likely fade after the baby is born.  A dark line from your belly button to the pubic area (linea nigra) may appear. This will likely fade after the baby is born.  You may have changes in your hair. These can include thickening of your hair, rapid growth, and changes in texture. Some women also have hair loss during or after pregnancy, or hair that feels dry or thin. Your hair will most likely return to normal after your baby is born. What to expect at prenatal visits During a routine prenatal visit:  You will be weighed to make sure you and the fetus are growing normally.  Your blood pressure will be taken.  Your abdomen will be measured to track your baby's growth.  The fetal heartbeat will be listened to.  Any test results from the previous visit will be discussed. Your health care provider may ask you:  How you are feeling.  If you are feeling the baby move.  If you have had any abnormal symptoms, such as leaking fluid, bleeding, severe headaches, or abdominal cramping.  If you are using any tobacco products, including cigarettes, chewing tobacco, and electronic cigarettes.  If you have any questions. Other tests that may be performed during your second trimester include:  Blood tests that check for: ? Low iron levels (anemia). ? High blood sugar that affects pregnant women (gestational diabetes) between 24 and 28 weeks. ? Rh antibodies. This is to check for a protein on red blood cells (Rh factor).  Urine tests to check for infections,  diabetes, or protein in the urine.    An ultrasound to confirm the proper growth and development of the baby.  An amniocentesis to check for possible genetic problems.  Fetal screens for spina bifida and Down syndrome.  HIV (human immunodeficiency virus) testing. Routine prenatal testing includes screening for HIV, unless you choose not to have this test. Follow these instructions at home: Medicines  Follow your health care provider's instructions regarding medicine use. Specific medicines may be either safe or unsafe to take during pregnancy.  Take a prenatal vitamin that contains at least 600 micrograms (mcg) of folic acid.  If you develop constipation, try taking a stool softener if your health care provider approves. Eating and drinking   Eat a balanced diet that includes fresh fruits and vegetables, whole grains, good sources of protein such as meat, eggs, or tofu, and low-fat dairy. Your health care provider will help you determine the amount of weight gain that is right for you.  Avoid raw meat and uncooked cheese. These carry germs that can cause birth defects in the baby.  If you have low calcium intake from food, talk to your health care provider about whether you should take a daily calcium supplement.  Limit foods that are high in fat and processed sugars, such as fried and sweet foods.  To prevent constipation: ? Drink enough fluid to keep your urine clear or pale yellow. ? Eat foods that are high in fiber, such as fresh fruits and vegetables, whole grains, and beans. Activity  Exercise only as directed by your health care provider. Most women can continue their usual exercise routine during pregnancy. Try to exercise for 30 minutes at least 5 days a week. Stop exercising if you experience uterine contractions.  Avoid heavy lifting, wear low heel shoes, and practice good posture.  A sexual relationship may be continued unless your health care provider directs you  otherwise. Relieving pain and discomfort  Wear a good support bra to prevent discomfort from breast tenderness.  Take warm sitz baths to soothe any pain or discomfort caused by hemorrhoids. Use hemorrhoid cream if your health care provider approves.  Rest with your legs elevated if you have leg cramps or low back pain.  If you develop varicose veins, wear support hose. Elevate your feet for 15 minutes, 3-4 times a day. Limit salt in your diet. Prenatal Care  Write down your questions. Take them to your prenatal visits.  Keep all your prenatal visits as told by your health care provider. This is important. Safety  Wear your seat belt at all times when driving.  Make a list of emergency phone numbers, including numbers for family, friends, the hospital, and police and fire departments. General instructions  Ask your health care provider for a referral to a local prenatal education class. Begin classes no later than the beginning of month 6 of your pregnancy.  Ask for help if you have counseling or nutritional needs during pregnancy. Your health care provider can offer advice or refer you to specialists for help with various needs.  Do not use hot tubs, steam rooms, or saunas.  Do not douche or use tampons or scented sanitary pads.  Do not cross your legs for long periods of time.  Avoid cat litter boxes and soil used by cats. These carry germs that can cause birth defects in the baby and possibly loss of the fetus by miscarriage or stillbirth.  Avoid all smoking, herbs, alcohol, and unprescribed drugs. Chemicals in these products can affect the formation and growth of   the baby.  Do not use any products that contain nicotine or tobacco, such as cigarettes and e-cigarettes. If you need help quitting, ask your health care provider.  Visit your dentist if you have not gone yet during your pregnancy. Use a soft toothbrush to brush your teeth and be gentle when you floss. Contact a  health care provider if:  You have dizziness.  You have mild pelvic cramps, pelvic pressure, or nagging pain in the abdominal area.  You have persistent nausea, vomiting, or diarrhea.  You have a bad smelling vaginal discharge.  You have pain when you urinate. Get help right away if:  You have a fever.  You are leaking fluid from your vagina.  You have spotting or bleeding from your vagina.  You have severe abdominal cramping or pain.  You have rapid weight gain or weight loss.  You have shortness of breath with chest pain.  You notice sudden or extreme swelling of your face, hands, ankles, feet, or legs.  You have not felt your baby move in over an hour.  You have severe headaches that do not go away when you take medicine.  You have vision changes. Summary  The second trimester is from week 14 through week 27 (months 4 through 6). It is also a time when the fetus is growing rapidly.  Your body goes through many changes during pregnancy. The changes vary from woman to woman.  Avoid all smoking, herbs, alcohol, and unprescribed drugs. These chemicals affect the formation and growth your baby.  Do not use any tobacco products, such as cigarettes, chewing tobacco, and e-cigarettes. If you need help quitting, ask your health care provider.  Contact your health care provider if you have any questions. Keep all prenatal visits as told by your health care provider. This is important. This information is not intended to replace advice given to you by your health care provider. Make sure you discuss any questions you have with your health care provider. Document Revised: 12/24/2018 Document Reviewed: 10/07/2016 [employer] Patient Education  2020 [employer]   Contraception Choices Contraception, also called birth control, refers to methods or devices that prevent pregnancy. Hormonal methods Contraceptive implant  A contraceptive implant is a thin, plastic tube that  contains a hormone. It is inserted into the upper part of the arm. It can remain in place for up to 3 years. Progestin-only injections Progestin-only injections are injections of progestin, a synthetic form of the hormone progesterone. They are given every 3 months by a health care provider. Birth control pills  Birth control pills are pills that contain hormones that prevent pregnancy. They must be taken once a day, preferably at the same time each day. Birth control patch  The birth control patch contains hormones that prevent pregnancy. It is placed on the skin and must be changed once a week for three weeks and removed on the fourth week. A prescription is needed to use this method of contraception. Vaginal ring  A vaginal ring contains hormones that prevent pregnancy. It is placed in the vagina for three weeks and removed on the fourth week. After that, the process is repeated with a new ring. A prescription is needed to use this method of contraception. Emergency contraceptive Emergency contraceptives prevent pregnancy after unprotected sex. They come in pill form and can be taken up to 5 days after sex. They work best the sooner they are taken after having sex. Most emergency contraceptives are available without a prescription. This   method should not be used as your only form of birth control. Barrier methods Female condom  A female condom is a thin sheath that is worn over the penis during sex. Condoms keep sperm from going inside a woman's body. They can be used with a spermicide to increase their effectiveness. They should be disposed after a single use. Female condom  A female condom is a soft, loose-fitting sheath that is put into the vagina before sex. The condom keeps sperm from going inside a woman's body. They should be disposed after a single use. Diaphragm  A diaphragm is a soft, dome-shaped barrier. It is inserted into the vagina before sex, along with a spermicide. The  diaphragm blocks sperm from entering the uterus, and the spermicide kills sperm. A diaphragm should be left in the vagina for 6-8 hours after sex and removed within 24 hours. A diaphragm is prescribed and fitted by a health care provider. A diaphragm should be replaced every 1-2 years, after giving birth, after gaining more than 15 lb (6.8 kg), and after pelvic surgery. Cervical cap  A cervical cap is a round, soft latex or plastic cup that fits over the cervix. It is inserted into the vagina before sex, along with spermicide. It blocks sperm from entering the uterus. The cap should be left in place for 6-8 hours after sex and removed within 48 hours. A cervical cap must be prescribed and fitted by a health care provider. It should be replaced every 2 years. Sponge  A sponge is a soft, circular piece of polyurethane foam with spermicide on it. The sponge helps block sperm from entering the uterus, and the spermicide kills sperm. To use it, you make it wet and then insert it into the vagina. It should be inserted before sex, left in for at least 6 hours after sex, and removed and thrown away within 30 hours. Spermicides Spermicides are chemicals that kill or block sperm from entering the cervix and uterus. They can come as a cream, jelly, suppository, foam, or tablet. A spermicide should be inserted into the vagina with an applicator at least 10-15 minutes before sex to allow time for it to work. The process must be repeated every time you have sex. Spermicides do not require a prescription. Intrauterine contraception Intrauterine device (IUD) An IUD is a T-shaped device that is put in a woman's uterus. There are two types:  Hormone IUD.This type contains progestin, a synthetic form of the hormone progesterone. This type can stay in place for 3-5 years.  Copper IUD.This type is wrapped in copper wire. It can stay in place for 10 years.  Permanent methods of contraception Female tubal ligation In  this method, a woman's fallopian tubes are sealed, tied, or blocked during surgery to prevent eggs from traveling to the uterus. Hysteroscopic sterilization In this method, a small, flexible insert is placed into each fallopian tube. The inserts cause scar tissue to form in the fallopian tubes and block them, so sperm cannot reach an egg. The procedure takes about 3 months to be effective. Another form of birth control must be used during those 3 months. Female sterilization This is a procedure to tie off the tubes that carry sperm (vasectomy). After the procedure, the man can still ejaculate fluid (semen). Natural planning methods Natural family planning In this method, a couple does not have sex on days when the woman could become pregnant. Calendar method This means keeping track of the length of each menstrual cycle,   identifying the days when pregnancy can happen, and not having sex on those days. Ovulation method In this method, a couple avoids sex during ovulation. Symptothermal method This method involves not having sex during ovulation. The woman typically checks for ovulation by watching changes in her temperature and in the consistency of cervical mucus. Post-ovulation method In this method, a couple waits to have sex until after ovulation. Summary  Contraception, also called birth control, means methods or devices that prevent pregnancy.  Hormonal methods of contraception include implants, injections, pills, patches, vaginal rings, and emergency contraceptives.  Barrier methods of contraception can include female condoms, female condoms, diaphragms, cervical caps, sponges, and spermicides.  There are two types of IUDs (intrauterine devices). An IUD can be put in a woman's uterus to prevent pregnancy for 3-5 years.  Permanent sterilization can be done through a procedure for males, females, or both.  Natural family planning methods involve not having sex on days when the woman could  become pregnant. This information is not intended to replace advice given to you by your health care provider. Make sure you discuss any questions you have with your health care provider. Document Revised: 09/03/2017 Document Reviewed: 10/04/2016 Elsevier Patient Education  2020 Elsevier Inc.   Breastfeeding  Choosing to breastfeed is one of the best decisions you can make for yourself and your baby. A change in hormones during pregnancy causes your breasts to make breast milk in your milk-producing glands. Hormones prevent breast milk from being released before your baby is born. They also prompt milk flow after birth. Once breastfeeding has begun, thoughts of your baby, as well as his or her sucking or crying, can stimulate the release of milk from your milk-producing glands. Benefits of breastfeeding Research shows that breastfeeding offers many health benefits for infants and mothers. It also offers a cost-free and convenient way to feed your baby. For your baby  Your first milk (colostrum) helps your baby's digestive system to function better.  Special cells in your milk (antibodies) help your baby to fight off infections.  Breastfed babies are less likely to develop asthma, allergies, obesity, or type 2 diabetes. They are also at lower risk for sudden infant death syndrome (SIDS).  Nutrients in breast milk are better able to meet your baby's needs compared to infant formula.  Breast milk improves your baby's brain development. For you  Breastfeeding helps to create a very special bond between you and your baby.  Breastfeeding is convenient. Breast milk costs nothing and is always available at the correct temperature.  Breastfeeding helps to burn calories. It helps you to lose the weight that you gained during pregnancy.  Breastfeeding makes your uterus return faster to its size before pregnancy. It also slows bleeding (lochia) after you give birth.  Breastfeeding helps to lower  your risk of developing type 2 diabetes, osteoporosis, rheumatoid arthritis, cardiovascular disease, and breast, ovarian, uterine, and endometrial cancer later in life. Breastfeeding basics Starting breastfeeding  Find a comfortable place to sit or lie down, with your neck and back well-supported.  Place a pillow or a rolled-up blanket under your baby to bring him or her to the level of your breast (if you are seated). Nursing pillows are specially designed to help support your arms and your baby while you breastfeed.  Make sure that your baby's tummy (abdomen) is facing your abdomen.  Gently massage your breast. With your fingertips, massage from the outer edges of your breast inward toward the nipple. This encourages   milk flow. If your milk flows slowly, you may need to continue this action during the feeding.  Support your breast with 4 fingers underneath and your thumb above your nipple (make the letter "C" with your hand). Make sure your fingers are well away from your nipple and your baby's mouth.  Stroke your baby's lips gently with your finger or nipple.  When your baby's mouth is open wide enough, quickly bring your baby to your breast, placing your entire nipple and as much of the areola as possible into your baby's mouth. The areola is the colored area around your nipple. ? More areola should be visible above your baby's upper lip than below the lower lip. ? Your baby's lips should be opened and extended outward (flanged) to ensure an adequate, comfortable latch. ? Your baby's tongue should be between his or her lower gum and your breast.  Make sure that your baby's mouth is correctly positioned around your nipple (latched). Your baby's lips should create a seal on your breast and be turned out (everted).  It is common for your baby to suck about 2-3 minutes in order to start the flow of breast milk. Latching Teaching your baby how to latch onto your breast properly is very  important. An improper latch can cause nipple pain, decreased milk supply, and poor weight gain in your baby. Also, if your baby is not latched onto your nipple properly, he or she may swallow some air during feeding. This can make your baby fussy. Burping your baby when you switch breasts during the feeding can help to get rid of the air. However, teaching your baby to latch on properly is still the best way to prevent fussiness from swallowing air while breastfeeding. Signs that your baby has successfully latched onto your nipple  Silent tugging or silent sucking, without causing you pain. Infant's lips should be extended outward (flanged).  Swallowing heard between every 3-4 sucks once your milk has started to flow (after your let-down milk reflex occurs).  Muscle movement above and in front of his or her ears while sucking. Signs that your baby has not successfully latched onto your nipple  Sucking sounds or smacking sounds from your baby while breastfeeding.  Nipple pain. If you think your baby has not latched on correctly, slip your finger into the corner of your baby's mouth to break the suction and place it between your baby's gums. Attempt to start breastfeeding again. Signs of successful breastfeeding Signs from your baby  Your baby will gradually decrease the number of sucks or will completely stop sucking.  Your baby will fall asleep.  Your baby's body will relax.  Your baby will retain a small amount of milk in his or her mouth.  Your baby will let go of your breast by himself or herself. Signs from you  Breasts that have increased in firmness, weight, and size 1-3 hours after feeding.  Breasts that are softer immediately after breastfeeding.  Increased milk volume, as well as a change in milk consistency and color by the fifth day of breastfeeding.  Nipples that are not sore, cracked, or bleeding. Signs that your baby is getting enough milk  Wetting at least 1-2  diapers during the first 24 hours after birth.  Wetting at least 5-6 diapers every 24 hours for the first week after birth. The urine should be clear or pale yellow by the age of 5 days.  Wetting 6-8 diapers every 24 hours as your baby continues   to grow and develop.  At least 3 stools in a 24-hour period by the age of 5 days. The stool should be soft and yellow.  At least 3 stools in a 24-hour period by the age of 7 days. The stool should be seedy and yellow.  No loss of weight greater than 10% of birth weight during the first 3 days of life.  Average weight gain of 4-7 oz (113-198 g) per week after the age of 4 days.  Consistent daily weight gain by the age of 5 days, without weight loss after the age of 2 weeks. After a feeding, your baby may spit up a small amount of milk. This is normal. Breastfeeding frequency and duration Frequent feeding will help you make more milk and can prevent sore nipples and extremely full breasts (breast engorgement). Breastfeed when you feel the need to reduce the fullness of your breasts or when your baby shows signs of hunger. This is called "breastfeeding on demand." Signs that your baby is hungry include:  Increased alertness, activity, or restlessness.  Movement of the head from side to side.  Opening of the mouth when the corner of the mouth or cheek is stroked (rooting).  Increased sucking sounds, smacking lips, cooing, sighing, or squeaking.  Hand-to-mouth movements and sucking on fingers or hands.  Fussing or crying. Avoid introducing a pacifier to your baby in the first 4-6 weeks after your baby is born. After this time, you may choose to use a pacifier. Research has shown that pacifier use during the first year of a baby's life decreases the risk of sudden infant death syndrome (SIDS). Allow your baby to feed on each breast as long as he or she wants. When your baby unlatches or falls asleep while feeding from the first breast, offer the  second breast. Because newborns are often sleepy in the first few weeks of life, you may need to awaken your baby to get him or her to feed. Breastfeeding times will vary from baby to baby. However, the following rules can serve as a guide to help you make sure that your baby is properly fed:  Newborns (babies 4 weeks of age or younger) may breastfeed every 1-3 hours.  Newborns should not go without breastfeeding for longer than 3 hours during the day or 5 hours during the night.  You should breastfeed your baby a minimum of 8 times in a 24-hour period. Breast milk pumping     Pumping and storing breast milk allows you to make sure that your baby is exclusively fed your breast milk, even at times when you are unable to breastfeed. This is especially important if you go back to work while you are still breastfeeding, or if you are not able to be present during feedings. Your lactation consultant can help you find a method of pumping that works best for you and give you guidelines about how long it is safe to store breast milk. Caring for your breasts while you breastfeed Nipples can become dry, cracked, and sore while breastfeeding. The following recommendations can help keep your breasts moisturized and healthy:  Avoid using soap on your nipples.  Wear a supportive bra designed especially for nursing. Avoid wearing underwire-style bras or extremely tight bras (sports bras).  Air-dry your nipples for 3-4 minutes after each feeding.  Use only cotton bra pads to absorb leaked breast milk. Leaking of breast milk between feedings is normal.  Use lanolin on your nipples after breastfeeding. Lanolin helps   to maintain your skin's normal moisture barrier. Pure lanolin is not harmful (not toxic) to your baby. You may also hand express a few drops of breast milk and gently massage that milk into your nipples and allow the milk to air-dry. In the first few weeks after giving birth, some women experience  breast engorgement. Engorgement can make your breasts feel heavy, warm, and tender to the touch. Engorgement peaks within 3-5 days after you give birth. The following recommendations can help to ease engorgement:  Completely empty your breasts while breastfeeding or pumping. You may want to start by applying warm, moist heat (in the shower or with warm, water-soaked hand towels) just before feeding or pumping. This increases circulation and helps the milk flow. If your baby does not completely empty your breasts while breastfeeding, pump any extra milk after he or she is finished.  Apply ice packs to your breasts immediately after breastfeeding or pumping, unless this is too uncomfortable for you. To do this: ? Put ice in a plastic bag. ? Place a towel between your skin and the bag. ? Leave the ice on for 20 minutes, 2-3 times a day.  Make sure that your baby is latched on and positioned properly while breastfeeding. If engorgement persists after 48 hours of following these recommendations, contact your health care provider or a lactation consultant. Overall health care recommendations while breastfeeding  Eat 3 healthy meals and 3 snacks every day. Well-nourished mothers who are breastfeeding need an additional 450-500 calories a day. You can meet this requirement by increasing the amount of a balanced diet that you eat.  Drink enough water to keep your urine pale yellow or clear.  Rest often, relax, and continue to take your prenatal vitamins to prevent fatigue, stress, and low vitamin and mineral levels in your body (nutrient deficiencies).  Do not use any products that contain nicotine or tobacco, such as cigarettes and e-cigarettes. Your baby may be harmed by chemicals from cigarettes that pass into breast milk and exposure to secondhand smoke. If you need help quitting, ask your health care provider.  Avoid alcohol.  Do not use illegal drugs or marijuana.  Talk with your health care  provider before taking any medicines. These include over-the-counter and prescription medicines as well as vitamins and herbal supplements. Some medicines that may be harmful to your baby can pass through breast milk.  It is possible to become pregnant while breastfeeding. If birth control is desired, ask your health care provider about options that will be safe while breastfeeding your baby. Where to find more information: [employer]: www.llli.org Contact a health care provider if:  You feel like you want to stop breastfeeding or have become frustrated with breastfeeding.  Your nipples are cracked or bleeding.  Your breasts are red, tender, or warm.  You have: ? Painful breasts or nipples. ? A swollen area on either breast. ? A fever or chills. ? Nausea or vomiting. ? Drainage other than breast milk from your nipples.  Your breasts do not become full before feedings by the fifth day after you give birth.  You feel sad and depressed.  Your baby is: ? Too sleepy to eat well. ? Having trouble sleeping. ? More than 1 week old and wetting fewer than 6 diapers in a 24-hour period. ? Not gaining weight by 5 days of age.  Your baby has fewer than 3 stools in a 24-hour period.  Your baby's skin or the white parts of   his or her eyes become yellow. Get help right away if:  Your baby is overly tired (lethargic) and does not want to wake up and feed.  Your baby develops an unexplained fever. Summary  Breastfeeding offers many health benefits for infant and mothers.  Try to breastfeed your infant when he or she shows early signs of hunger.  Gently tickle or stroke your baby's lips with your finger or nipple to allow the baby to open his or her mouth. Bring the baby to your breast. Make sure that much of the areola is in your baby's mouth. Offer one side and burp the baby before you offer the other side.  Talk with your health care provider or lactation consultant  if you have questions or you face problems as you breastfeed. This information is not intended to replace advice given to you by your health care provider. Make sure you discuss any questions you have with your health care provider. Document Revised: 11/26/2017 Document Reviewed: 10/03/2016 Elsevier Patient Education  2020 Elsevier Inc.  

## 2020-06-08 NOTE — Progress Notes (Signed)
NOB ID #[id number] Jill [name]   Genetic Screening: Yes. Wants to know Gender.   Last Pap: 02/03/2018  WNL    CC: Nausea wants medication also notes dizziness. Pt would also like a PNV sent today.   FHT's not heard will need provider assistance.
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
## 2020-06-09 LAB — CBC/D/PLT+RPR+RH+ABO+RUB AB...
Antibody Screen: NEGATIVE
Basophils Absolute: 0 10*3/uL (ref 0.0–0.2)
Basos: 0 %
EOS (ABSOLUTE): 0.1 10*3/uL (ref 0.0–0.4)
Eos: 1 %
HCV Ab: <0.1 s/co ratio (ref 0.0–0.9)
HIV Screen 4th Generation wRfx: NONREACTIVE
Hematocrit: 36.5 % (ref 34.0–46.6)
Hemoglobin: 12.5 g/dL (ref 11.1–15.9)
Hepatitis B Surface Ag: NEGATIVE
Immature Grans (Abs): 0 10*3/uL (ref 0.0–0.1)
Immature Granulocytes: 0 %
Lymphocytes Absolute: 1.8 10*3/uL (ref 0.7–3.1)
Lymphs: 23 %
MCH: 31.2 pg (ref 26.6–33.0)
MCHC: 34.2 g/dL (ref 31.5–35.7)
MCV: 91 fL (ref 79–97)
Monocytes Absolute: 0.4 10*3/uL (ref 0.1–0.9)
Monocytes: 6 %
Neutrophils Absolute: 5.5 10*3/uL (ref 1.4–7.0)
Neutrophils: 70 %
Platelets: 146 10*3/uL — ABNORMAL LOW (ref 150–450)
RBC: 4.01 x10E6/uL (ref 3.77–5.28)
RDW: 13.5 % (ref 11.7–15.4)
RPR Ser Ql: NONREACTIVE
Rh Factor: POSITIVE
Rubella Antibodies, IGG: 4.63 index (ref >0.99)
WBC: 7.9 10*3/uL (ref 3.4–10.8)

## 2020-06-09 LAB — HCV INTERPRETATION

## 2020-06-12 LAB — URINE CULTURE, OB REFLEX: Organism ID, Bacteria: NO GROWTH

## 2020-06-12 LAB — CULTURE, OB URINE

## 2020-06-12 LAB — CERVICOVAGINAL ANCILLARY ONLY
Chlamydia: NEGATIVE
Comment: NEGATIVE
Comment: NORMAL
Neisseria Gonorrhea: NEGATIVE

## 2020-06-18 ENCOUNTER — Encounter: Payer: Self-pay | Admitting: Obstetrics and Gynecology

## 2020-06-20 ENCOUNTER — Encounter: Payer: Self-pay | Admitting: Obstetrics and Gynecology

## 2020-07-06 ENCOUNTER — Ambulatory Visit (INDEPENDENT_AMBULATORY_CARE_PROVIDER_SITE_OTHER): Payer: Medicaid Other | Admitting: Certified Nurse Midwife

## 2020-07-06 ENCOUNTER — Other Ambulatory Visit: Payer: Self-pay

## 2020-07-06 VITALS — BP 121/83 | HR 83 | Wt 134.6 lb

## 2020-07-06 DIAGNOSIS — Z3A14 14 weeks gestation of pregnancy: Secondary | ICD-10-CM

## 2020-07-06 DIAGNOSIS — Z3481 Encounter for supervision of other normal pregnancy, first trimester: Secondary | ICD-10-CM

## 2020-07-06 DIAGNOSIS — Z3009 Encounter for other general counseling and advice on contraception: Secondary | ICD-10-CM
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
## 2020-07-06 NOTE — Progress Notes (Signed)
Pt is here for ROB, [redacted]w[redacted]d.

## 2020-08-03 ENCOUNTER — Other Ambulatory Visit: Payer: Self-pay

## 2020-08-03 ENCOUNTER — Encounter: Payer: Self-pay | Admitting: Certified Nurse Midwife

## 2020-08-03 ENCOUNTER — Ambulatory Visit (INDEPENDENT_AMBULATORY_CARE_PROVIDER_SITE_OTHER): Payer: Medicaid Other | Admitting: Certified Nurse Midwife

## 2020-08-03 VITALS — BP 110/76 | HR 89 | Wt 137.0 lb

## 2020-08-03 DIAGNOSIS — Z3A18 18 weeks gestation of pregnancy: Secondary | ICD-10-CM

## 2020-08-03 DIAGNOSIS — O09522 Supervision of elderly multigravida, second trimester: Secondary | ICD-10-CM

## 2020-08-03 DIAGNOSIS — Z789 Other specified health status: Secondary | ICD-10-CM

## 2020-08-03 DIAGNOSIS — Z3480 Encounter for supervision of other normal pregnancy, unspecified trimester: Secondary | ICD-10-CM

## 2020-08-03 NOTE — Progress Notes (Signed)
Pt states she is doing well today.
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
## 2020-08-05 LAB — AFP, SERUM, OPEN SPINA BIFIDA
AFP MoM: 1.79
AFP Value: 89 ng/mL
Gest. Age on Collection Date: 18.5 weeks
Maternal Age At EDD: 38.2 yr
OSBR Risk 1 IN: 1309
Test Results:: NEGATIVE
Weight: 137 [lb_av]

## 2020-08-06 ENCOUNTER — Other Ambulatory Visit: Payer: Self-pay | Admitting: *Deleted

## 2020-08-06 ENCOUNTER — Other Ambulatory Visit: Payer: Self-pay

## 2020-08-06 ENCOUNTER — Other Ambulatory Visit: Payer: Self-pay | Admitting: Obstetrics and Gynecology

## 2020-08-06 ENCOUNTER — Ambulatory Visit: Payer: Medicaid Other | Attending: Obstetrics and Gynecology

## 2020-08-06 DIAGNOSIS — O09522 Supervision of elderly multigravida, second trimester: Secondary | ICD-10-CM

## 2020-08-06 DIAGNOSIS — Z3480 Encounter for supervision of other normal pregnancy, unspecified trimester: Secondary | ICD-10-CM

## 2020-08-31 ENCOUNTER — Other Ambulatory Visit: Payer: Self-pay

## 2020-08-31 ENCOUNTER — Ambulatory Visit (INDEPENDENT_AMBULATORY_CARE_PROVIDER_SITE_OTHER): Payer: Medicaid Other | Admitting: Obstetrics and Gynecology

## 2020-08-31 VITALS — BP 107/74 | HR 94 | Wt 141.0 lb

## 2020-08-31 DIAGNOSIS — O09522 Supervision of elderly multigravida, second trimester: Secondary | ICD-10-CM

## 2020-08-31 DIAGNOSIS — Z23 Encounter for immunization: Secondary | ICD-10-CM

## 2020-08-31 DIAGNOSIS — Z3480 Encounter for supervision of other normal pregnancy, unspecified trimester: Secondary | ICD-10-CM

## 2020-08-31 NOTE — Addendum Note (Signed)
Addended by: Kennon Portela on: 08/31/2020 09:21 AM   Modules accepted: Orders
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
## 2020-08-31 NOTE — Progress Notes (Signed)
ROB   AFP done on 08/03/20 WNL   CC: None

## 2020-09-03 ENCOUNTER — Encounter: Payer: Self-pay | Admitting: *Deleted

## 2020-09-03 ENCOUNTER — Ambulatory Visit: Payer: Medicaid Other | Attending: Obstetrics and Gynecology

## 2020-09-03 ENCOUNTER — Ambulatory Visit: Payer: Medicaid Other | Admitting: *Deleted

## 2020-09-03 ENCOUNTER — Other Ambulatory Visit: Payer: Self-pay

## 2020-09-03 VITALS — BP 99/71 | HR 91

## 2020-09-03 DIAGNOSIS — Z363 Encounter for antenatal screening for malformations: Secondary | ICD-10-CM | POA: Diagnosis not present

## 2020-09-03 DIAGNOSIS — Z3A24 24 weeks gestation of pregnancy: Secondary | ICD-10-CM | POA: Diagnosis not present

## 2020-09-03 DIAGNOSIS — O09522 Supervision of elderly multigravida, second trimester: Secondary | ICD-10-CM | POA: Insufficient documentation

## 2020-09-15 NOTE — L&D Delivery Note (Signed)
OB/GYN Faculty Practice Delivery Note  Jill [name] is a 40 y.o. [phone] s/p vag del at [redacted]w[redacted]d. She was admitted for spont latent labor.   ROM: 5h 64m with light MSF fluid GBS Status: neg Maximum Maternal Temperature: 98.6  Labor Progress: Marland Kitchen [name] was admitted in the morning in latent labor; she had AROM to facilitate active labor, and then 49mu of Pitocin once she was 9cm to assist with dilation and pushing. She was complete at 1400 and pushed well to vag del.  Delivery Date/Time: December 25, 2020 at 1407 Delivery: Called to room and patient was complete and pushing. Head delivered ROA. No nuchal cord present. Shoulder and body delivered in usual fashion. Infant with spontaneous cry, placed on mother's abdomen, dried and stimulated. Cord clamped x 2 after 1-minute delay, and cut by FOB. Cord blood drawn. Placenta delivered spontaneously with gentle cord traction. Fundus firm with massage and Pitocin. Labia, perineum, vagina, and cervix inspected inspected and found to be intact. She expressed a desire for ppBTL, which will be planned for later this afternoon. NPO in the meantime.  Placenta: spont, intact Complications: none Lacerations: none EBL: 150cc Analgesia: none  Postpartum Planning [x]  message to sent to schedule follow-up   Infant: boy  APGARs 9/10  4109g (9lb 3.8oz)  11/10, CNM  12/25/2020 2:34 PM

## 2020-09-28 ENCOUNTER — Encounter: Payer: Self-pay | Admitting: Obstetrics and Gynecology

## 2020-09-28 ENCOUNTER — Other Ambulatory Visit: Payer: Self-pay

## 2020-09-28 ENCOUNTER — Ambulatory Visit (INDEPENDENT_AMBULATORY_CARE_PROVIDER_SITE_OTHER): Payer: Medicaid Other | Admitting: Obstetrics and Gynecology

## 2020-09-28 VITALS — BP 118/78 | HR 86 | Wt 144.0 lb

## 2020-09-28 DIAGNOSIS — Z789 Other specified health status: Secondary | ICD-10-CM

## 2020-09-28 DIAGNOSIS — Z3009 Encounter for other general counseling and advice on contraception: Secondary | ICD-10-CM | POA: Insufficient documentation

## 2020-09-28 DIAGNOSIS — O09529 Supervision of elderly multigravida, unspecified trimester: Secondary | ICD-10-CM

## 2020-09-28 DIAGNOSIS — Z3480 Encounter for supervision of other normal pregnancy, unspecified trimester: Secondary | ICD-10-CM

## 2020-09-28 NOTE — Progress Notes (Signed)
ROB 28w  Per notes discuss BTL .  Pt needs 2hr GTT Lab visit declined doing lab today.   CC: None

## 2020-09-28 NOTE — Progress Notes (Signed)
Subjective:  Jill [name] is a 40 y.o. [phone] at [redacted]w[redacted]d being seen today for ongoing prenatal care.  She is currently monitored for the following issues for this low-risk pregnancy and has Foreign body (FB) in soft tissue; Non-English speaking patient; Supervision of other normal pregnancy, antepartum; AMA (advanced maternal age) multigravida 35+; and Unwanted fertility on their problem list.  Patient reports no complaints.  Contractions: Not present. Vag. Bleeding: None.  Movement: Present. Denies leaking of fluid.   The following portions of the patient's history were reviewed and updated as appropriate: allergies, current medications, past family history, past medical history, past social history, past surgical history and problem list. Problem list updated.  Objective:   Vitals:   09/28/20 0813  BP: 118/78  Pulse: 86  Weight: 144 lb (65.3 kg)    Fetal Status: Fetal Heart Rate (bpm): 150   Movement: Present     General:  Alert, oriented and cooperative. Patient is in no acute distress.  Skin: Skin is warm and dry. No rash noted.   Cardiovascular: Normal heart rate noted  Respiratory: Normal respiratory effort, no problems with respiration noted  Abdomen: Soft, gravid, appropriate for gestational age. Pain/Pressure: Absent     Pelvic:  Cervical exam deferred        Extremities: Normal range of motion.  Edema: None  Mental Status: Normal mood and affect. Normal behavior. Normal judgment and thought content.   Urinalysis:      Assessment and Plan:  Pregnancy: G4P3003 at [redacted]w[redacted]d  1. Antepartum multigravida of advanced maternal age LR NIPS  2. Supervision of other normal pregnancy, antepartum 28 week labs next week  3. Non-English speaking patient Video interrupter used during today's visit  4. Unwanted fertility BTL papers signed today Patient desires bilateral tubal sterilization.  Other reversible forms of contraception were discussed with patient; she declines all other  modalities. Discussed bilateral tubal sterilization in detail; discussed options of postpartum bilateral tubal sterilization using Filshie clips vs bilateral salpingectomy. Risks and benefits discussed in detail including but not limited to: risk of regret, permanence of method, bleeding, infection, injury to surrounding organs and need for additional procedures.  Failure risk of 1-2 % for Filshie clips and <1% for bilateral salpingectomy with increased risk of ectopic gestation if pregnancy occurs was also discussed with patient.  Also discussed possible reduction of risk of ovarian cancer via bilateral salpingectomy given that a growing body of knowledge reveals that the majority of cases of high grade serous "ovarian" cancer actually are actually  cancers arising from the fimbriated end of the fallopian tubes. Emphasized that removal of fallopian tubes do not result in any known hormonal imbalance.  Patient verbalized understanding of these risks and benefits and wants to proceed with sterilization method to be determined at time of procedure. Medicaid papers signed today  Preterm labor symptoms and general obstetric precautions including but not limited to vaginal bleeding, contractions, leaking of fluid and fetal movement were reviewed in detail with the patient. Please refer to After Visit Summary for other counseling recommendations.  Return in about 2 weeks (around 10/12/2020) for OB visit, face to face, any provider.   Hermina Staggers, MD

## 2020-09-28 NOTE — Patient Instructions (Signed)

## 2020-10-15 ENCOUNTER — Other Ambulatory Visit: Payer: Medicaid Other

## 2020-10-15 ENCOUNTER — Ambulatory Visit (INDEPENDENT_AMBULATORY_CARE_PROVIDER_SITE_OTHER): Payer: Medicaid Other | Admitting: Advanced Practice Midwife

## 2020-10-15 ENCOUNTER — Other Ambulatory Visit: Payer: Self-pay

## 2020-10-15 VITALS — BP 103/72 | HR 87 | Wt 147.6 lb

## 2020-10-15 DIAGNOSIS — O26893 Other specified pregnancy related conditions, third trimester: Secondary | ICD-10-CM

## 2020-10-15 DIAGNOSIS — Z789 Other specified health status: Secondary | ICD-10-CM

## 2020-10-15 DIAGNOSIS — R102 Pelvic and perineal pain: Secondary | ICD-10-CM

## 2020-10-15 DIAGNOSIS — O0990 Supervision of high risk pregnancy, unspecified, unspecified trimester: Secondary | ICD-10-CM

## 2020-10-15 MED ORDER — CYCLOBENZAPRINE HCL 10 MG PO TABS: 5.0000 mg | ORAL_TABLET | Freq: Three times a day (TID) | ORAL | 0 refills | Status: DC | PRN

## 2020-10-15 MED ORDER — COMFORT FIT MATERNITY SUPP MED MISC: 1.0000 | Freq: Every day | 0 refills | Status: DC
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
## 2020-10-16 ENCOUNTER — Encounter: Payer: Self-pay | Admitting: Advanced Practice Midwife

## 2020-10-16 DIAGNOSIS — D696 Thrombocytopenia, unspecified: Secondary | ICD-10-CM | POA: Insufficient documentation

## 2020-10-16 LAB — GLUCOSE TOLERANCE, 2 HOURS W/ 1HR
Glucose, 1 hour: 142 mg/dL (ref 65–179)
Glucose, 2 hour: 127 mg/dL (ref 65–152)
Glucose, Fasting: 77 mg/dL (ref 65–91)

## 2020-10-16 LAB — RPR: RPR Ser Ql: NONREACTIVE

## 2020-10-16 LAB — CBC
Hematocrit: 36.8 % (ref 34.0–46.6)
Hemoglobin: 12.3 g/dL (ref 11.1–15.9)
MCH: 31.7 pg (ref 26.6–33.0)
MCHC: 33.4 g/dL (ref 31.5–35.7)
MCV: 95 fL (ref 79–97)
Platelets: 138 10*3/uL — ABNORMAL LOW (ref 150–450)
RBC: 3.88 x10E6/uL (ref 3.77–5.28)
RDW: 13.4 % (ref 11.7–15.4)
WBC: 11.3 10*3/uL — ABNORMAL HIGH (ref 3.4–10.8)

## 2020-10-16 LAB — HIV ANTIBODY (ROUTINE TESTING W REFLEX): HIV Screen 4th Generation wRfx: NONREACTIVE

## 2020-10-29 ENCOUNTER — Other Ambulatory Visit: Payer: Self-pay

## 2020-10-29 ENCOUNTER — Ambulatory Visit (INDEPENDENT_AMBULATORY_CARE_PROVIDER_SITE_OTHER): Payer: Medicaid Other | Admitting: Advanced Practice Midwife

## 2020-10-29 VITALS — BP 108/76 | HR 94 | Wt 148.0 lb

## 2020-10-29 DIAGNOSIS — O0990 Supervision of high risk pregnancy, unspecified, unspecified trimester: Secondary | ICD-10-CM

## 2020-10-29 DIAGNOSIS — R102 Pelvic and perineal pain: Secondary | ICD-10-CM

## 2020-10-29 DIAGNOSIS — Z789 Other specified health status: Secondary | ICD-10-CM

## 2020-10-29 DIAGNOSIS — O26893 Other specified pregnancy related conditions, third trimester: Secondary | ICD-10-CM

## 2020-10-29 NOTE — Patient Instructions (Signed)

## 2020-10-29 NOTE — Progress Notes (Signed)
[name] Interpreter [phone] ROB, reports no problems today.

## 2020-10-29 NOTE — Progress Notes (Signed)
[id number]   PRENATAL VISIT NOTE  Subjective:  Jill [name] is a 40 y.o. G4P3003 at [redacted]w[redacted]d being seen today for ongoing prenatal care.  She is currently monitored for the following issues for this high-risk pregnancy and has Foreign body (FB) in soft tissue; Non-English speaking patient; Supervision of other normal pregnancy, antepartum; AMA (advanced maternal age) multigravida 35+; Unwanted fertility; and Thrombocytopenia affecting pregnancy (HCC) on their problem list.  Patient reports backache.  Contractions: Not present. Vag. Bleeding: None.  Movement: Present. Denies leaking of fluid.   The following portions of the patient's history were reviewed and updated as appropriate: allergies, current medications, past family history, past medical history, past social history, past surgical history and problem list.   Objective:   Vitals:   10/29/20 0834  BP: 108/76  Pulse: 94  Weight: 148 lb (67.1 kg)    Fetal Status: Fetal Heart Rate (bpm): 140   Movement: Present     General:  Alert, oriented and cooperative. Patient is in no acute distress.  Skin: Skin is warm and dry. No rash noted.   Cardiovascular: Normal heart rate noted  Respiratory: Normal respiratory effort, no problems with respiration noted  Abdomen: Soft, gravid, appropriate for gestational age.  Pain/Pressure: Present     Pelvic: Cervical exam deferred        Extremities: Normal range of motion.  Edema: None  Mental Status: Normal mood and affect. Normal behavior. Normal judgment and thought content.   Assessment and Plan:  Pregnancy: G4P3003 at [redacted]w[redacted]d 1. Language barrier affecting health care --[name] interpreter used via language line for all communication  2. Supervision of high risk pregnancy, antepartum --For AMA only, no other risk factors --Anticipatory guidance about next visits/weeks of pregnancy given. --Reviewed normal 28 week labs including glucose testing, normal [place], and EDD as set by 20 week [place], no further [place]  unless clinically indicated --Confirmed fetal position as LOA, vertex at pt request today via [place] --Next visit in 2 weeks in office with [name] interpreter  3. Pelvic pain affecting pregnancy in third trimester, antepartum --Renew Rx for Flexeril, pt to get low cost pregnancy support belt on [employer] if no medical supply take Medicaid.  --Rest/ice/heat/warm bath/Tylenol   Preterm labor symptoms and general obstetric precautions including but not limited to vaginal bleeding, contractions, leaking of fluid and fetal movement were reviewed in detail with the patient. Please refer to After Visit Summary for other counseling recommendations.   Return in about 2 weeks (around 11/12/2020).  No future appointments.  Sharen Counter, CNM

## 2020-11-13 ENCOUNTER — Ambulatory Visit (INDEPENDENT_AMBULATORY_CARE_PROVIDER_SITE_OTHER): Payer: Medicaid Other | Admitting: Advanced Practice Midwife

## 2020-11-13 ENCOUNTER — Other Ambulatory Visit: Payer: Self-pay

## 2020-11-13 VITALS — BP 123/81 | HR 103 | Wt 148.0 lb

## 2020-11-13 DIAGNOSIS — O26893 Other specified pregnancy related conditions, third trimester: Secondary | ICD-10-CM

## 2020-11-13 DIAGNOSIS — O0990 Supervision of high risk pregnancy, unspecified, unspecified trimester: Secondary | ICD-10-CM

## 2020-11-13 DIAGNOSIS — Z789 Other specified health status: Secondary | ICD-10-CM

## 2020-11-13 DIAGNOSIS — R12 Heartburn: Secondary | ICD-10-CM

## 2020-11-13 MED ORDER — PRENATE PIXIE 10-0.6-0.4-200 MG PO CAPS: 1.0000 | ORAL_CAPSULE | Freq: Every day | ORAL | 5 refills | Status: DC

## 2020-11-13 MED ORDER — FAMOTIDINE 40 MG PO TABS: 40.0000 mg | ORAL_TABLET | Freq: Every day | ORAL | 2 refills | Status: DC

## 2020-11-13 NOTE — Progress Notes (Signed)
Pt c/o increased acid reflux. States she had this issue with her previous pregnancy. Pt is requesting Rx be sent to pharmacy. + Fetal movement. Pt also needs RF of prenatal vitamin.

## 2020-11-13 NOTE — Patient Instructions (Signed)

## 2020-11-13 NOTE — Progress Notes (Signed)
   PRENATAL VISIT NOTE  Subjective:  Jill [name] is a 40 y.o. G4P3003 at [redacted]w[redacted]d being seen today for ongoing prenatal care.  She is currently monitored for the following issues for this high-risk pregnancy and has Foreign body (FB) in soft tissue; Non-English speaking patient; Supervision of other normal pregnancy, antepartum; AMA (advanced maternal age) multigravida 35+; Unwanted fertility; and Thrombocytopenia affecting pregnancy (HCC) on their problem list.  Patient reports heartburn.  Contractions: Not present. Vag. Bleeding: None.  Movement: Present. Denies leaking of fluid.   The following portions of the patient's history were reviewed and updated as appropriate: allergies, current medications, past family history, past medical history, past social history, past surgical history and problem list.   Objective:   Vitals:   11/13/20 0820  BP: 123/81  Pulse: (!) 103  Weight: 148 lb (67.1 kg)    Fetal Status: Fetal Heart Rate (bpm): 144   Movement: Present     General:  Alert, oriented and cooperative. Patient is in no acute distress.  Skin: Skin is warm and dry. No rash noted.   Cardiovascular: Normal heart rate noted  Respiratory: Normal respiratory effort, no problems with respiration noted  Abdomen: Soft, gravid, appropriate for gestational age.  Pain/Pressure: Absent     Pelvic: Cervical exam deferred        Extremities: Normal range of motion.  Edema: None  Mental Status: Normal mood and affect. Normal behavior. Normal judgment and thought content.   Assessment and Plan:  Pregnancy: G4P3003 at [redacted]w[redacted]d 1. Heartburn during pregnancy in third trimester --Discussed dietary changes - famotidine (PEPCID) 40 MG tablet; Take 1 tablet (40 mg total) by mouth daily.  Dispense: 30 tablet; Refill: 2  2. Supervision of high risk pregnancy, antepartum --Anticipatory guidance about next visits/weeks of pregnancy given. --Next visit in 2 weeks in office for GBS  - Prenat-FeAsp-Meth-FA-DHA  w/o A (PRENATE PIXIE) 10-0.6-0.4-200 MG CAPS; Take 1 capsule by mouth daily.  Dispense: 30 capsule; Refill: 5  3. Language barrier affecting health care --Jill [name] video interpreter used for all communication  Preterm labor symptoms and general obstetric precautions including but not limited to vaginal bleeding, contractions, leaking of fluid and fetal movement were reviewed in detail with the patient. Please refer to After Visit Summary for other counseling recommendations.   No follow-ups on file.  No future appointments.  Sharen Counter, CNM

## 2020-11-26 ENCOUNTER — Other Ambulatory Visit: Payer: Self-pay

## 2020-11-26 ENCOUNTER — Other Ambulatory Visit (HOSPITAL_COMMUNITY)
Admission: RE | Admit: 2020-11-26 | Discharge: 2020-11-26 | Disposition: A | Discharge status: Home / Self Care | Payer: Medicaid Other | Source: Ambulatory Visit | Attending: Advanced Practice Midwife | Admitting: Advanced Practice Midwife

## 2020-11-26 ENCOUNTER — Ambulatory Visit (INDEPENDENT_AMBULATORY_CARE_PROVIDER_SITE_OTHER): Payer: Medicaid Other | Admitting: Advanced Practice Midwife

## 2020-11-26 VITALS — BP 106/69 | HR 98 | Wt 149.2 lb

## 2020-11-26 DIAGNOSIS — O99119 Other diseases of the blood and blood-forming organs and certain disorders involving the immune mechanism complicating pregnancy, unspecified trimester: Secondary | ICD-10-CM | POA: Diagnosis not present

## 2020-11-26 DIAGNOSIS — Z3480 Encounter for supervision of other normal pregnancy, unspecified trimester: Secondary | ICD-10-CM | POA: Diagnosis not present

## 2020-11-26 DIAGNOSIS — R12 Heartburn: Secondary | ICD-10-CM

## 2020-11-26 DIAGNOSIS — D696 Thrombocytopenia, unspecified: Secondary | ICD-10-CM | POA: Diagnosis not present

## 2020-11-26 DIAGNOSIS — O26893 Other specified pregnancy related conditions, third trimester: Secondary | ICD-10-CM

## 2020-11-26 NOTE — Patient Instructions (Signed)
Labor Precautions Reasons to come to [facility] at [facility]:  1.  Contractions are  5 minutes apart or less, each last 1 minute, these have been going on for 1-2 hours, and you cannot walk or talk during them 2.  You have a large gush of fluid, or a trickle of fluid that will not stop and you have to wear a pad 3.  You have bleeding that is bright red, heavier than spotting--like menstrual bleeding (spotting can be normal in early labor or after a check of your cervix) 4.  You do not feel the baby moving like he/she normally does  

## 2020-11-26 NOTE — Progress Notes (Signed)
   PRENATAL VISIT NOTE  Subjective:  Jill [name] is a 40 y.o. [phone] at [redacted]w[redacted]d being seen today for ongoing prenatal care.  She is currently monitored for the following issues for this low-risk pregnancy and has Foreign body (FB) in soft tissue; Non-English speaking patient; Supervision of other normal pregnancy, antepartum; AMA (advanced maternal age) multigravida 35+; Unwanted fertility; and Thrombocytopenia affecting pregnancy (HCC) on their problem list.  Patient reports no complaints.  Contractions: Not present. Vag. Bleeding: None.  Movement: Present. Denies leaking of fluid.   The following portions of the patient's history were reviewed and updated as appropriate: allergies, current medications, past family history, past medical history, past social history, past surgical history and problem list.   Objective:   Vitals:   11/26/20 0834  BP: 106/69  Pulse: 98  Weight: 149 lb 3.2 oz (67.7 kg)    Fetal Status: Fetal Heart Rate (bpm): 137 Fundal Height: 35 cm Movement: Present  Presentation: Vertex  General:  Alert, oriented and cooperative. Patient is in no acute distress.  Skin: Skin is warm and dry. No rash noted.   Cardiovascular: Normal heart rate noted  Respiratory: Normal respiratory effort, no problems with respiration noted  Abdomen: Soft, gravid, appropriate for gestational age.  Pain/Pressure: Absent     Pelvic: Cervical exam performed in the presence of a chaperone Dilation: 1 Effacement (%): 0 Station: -3  Extremities: Normal range of motion.  Edema: None  Mental Status: Normal mood and affect. Normal behavior. Normal judgment and thought content.   Assessment and Plan:  Pregnancy: G4P3003 at [redacted]w[redacted]d 1. Supervision of other normal pregnancy, antepartum --AMA, no other risk factors --Cervix checked at pt request, 1/thick/posterior --Anticipatory guidance about next visits/weeks of pregnancy given. --Next visit in 1 week in the office  - Culture, beta strep (group b  only) - GC/Chlamydia probe amp ([place])not at [facility]  2. Thrombocytopenia affecting pregnancy (HCC) --Plts 136 on 10/15/20, likely benign gestational thrombocytopenia, will recheck today. - CBC  Preterm labor symptoms and general obstetric precautions including but not limited to vaginal bleeding, contractions, leaking of fluid and fetal movement were reviewed in detail with the patient. Please refer to After Visit Summary for other counseling recommendations.   Return in about 1 week (around 12/03/2020).  No future appointments.  Sharen Counter, CNM

## 2020-11-27 LAB — CBC
Hematocrit: 36.9 % (ref 34.0–46.6)
Hemoglobin: 12.6 g/dL (ref 11.1–15.9)
MCH: 31.7 pg (ref 26.6–33.0)
MCHC: 34.1 g/dL (ref 31.5–35.7)
MCV: 93 fL (ref 79–97)
Platelets: 134 10*3/uL — ABNORMAL LOW (ref 150–450)
RBC: 3.97 x10E6/uL (ref 3.77–5.28)
RDW: 12.7 % (ref 11.7–15.4)
WBC: 11.4 10*3/uL — ABNORMAL HIGH (ref 3.4–10.8)

## 2020-11-27 LAB — GC/CHLAMYDIA PROBE AMP (~~LOC~~) NOT AT ARMC
Chlamydia: NEGATIVE
Comment: NEGATIVE
Comment: NORMAL
Neisseria Gonorrhea: NEGATIVE

## 2020-11-30 LAB — CULTURE, BETA STREP (GROUP B ONLY): Strep Gp B Culture: NEGATIVE

## 2020-12-03 ENCOUNTER — Other Ambulatory Visit: Payer: Self-pay

## 2020-12-03 ENCOUNTER — Ambulatory Visit (INDEPENDENT_AMBULATORY_CARE_PROVIDER_SITE_OTHER): Payer: Medicaid Other | Admitting: Obstetrics and Gynecology

## 2020-12-03 VITALS — BP 100/68 | HR 102 | Wt 151.0 lb

## 2020-12-03 DIAGNOSIS — Z3A37 37 weeks gestation of pregnancy: Secondary | ICD-10-CM

## 2020-12-03 DIAGNOSIS — Z3480 Encounter for supervision of other normal pregnancy, unspecified trimester: Secondary | ICD-10-CM

## 2020-12-03 DIAGNOSIS — Z789 Other specified health status: Secondary | ICD-10-CM

## 2020-12-03 NOTE — Progress Notes (Signed)
ROB, reports no problems today. 

## 2020-12-03 NOTE — Progress Notes (Signed)
°  HIGH-RISK PREGNANCY OFFICE VISIT Patient name: Jill [name] MRN [id number]  Date of birth: Jun 29, 1981 Chief Complaint:   Routine Prenatal Visit  History of Present Illness:   Jill [name] is a 40 y.o. G34P3003 female at [redacted]w[redacted]d with an Estimated Date of Delivery: 12/19/20 being seen today for ongoing management of a high-risk pregnancy complicated by AMA 40yo, unwanted fertility, Thrombocytopenia affecting pregnancy, and language barrier affecting healthcare. Today she reports occasional contractions. Contractions: Not present. Vag. Bleeding: None.  Movement: Absent. denies leaking of fluid.  Review of Systems:   Pertinent items are noted in HPI Denies abnormal vaginal discharge w/ itching/odor/irritation, headaches, visual changes, shortness of breath, chest pain, abdominal pain, severe nausea/vomiting, or problems with urination or bowel movements unless otherwise stated above. Pertinent History Reviewed:  Reviewed past medical,surgical, social, obstetrical and family history.  Reviewed problem list, medications and allergies. Physical Assessment:   Vitals:   12/03/20 1343  BP: 100/68  Pulse: (!) 102  Weight: 151 lb (68.5 kg)  Body mass index is 28.53 kg/m.           Physical Examination:   General appearance: alert, well appearing, and in no distress  Mental status: alert, oriented to person, place, and time  Skin: warm & dry   Extremities: Edema: None    Cardiovascular: normal heart rate noted  Respiratory: normal respiratory effort, no distress  Abdomen: gravid, soft, non-tender  Pelvic: Cervical exam deferred         Fetal Status: Fetal Heart Rate (bpm): 143 Fundal Height: 38 cm Movement: Absent Presentation: Vertex  Fetal Surveillance Testing today: none   No results found for this or any previous visit (from the past 24 hour(s)).  Assessment & Plan:  1) High-risk pregnancy G4P3003 at [redacted]w[redacted]d with an Estimated Date of Delivery: 12/19/20   2) Supervision of other normal  pregnancy, antepartum  3) Language barrier affecting health care - [employer] Video Zada Finders, [place] [phone] used for entire visit   4) [redacted] weeks gestation of pregnancy   Meds: No orders of the defined types were placed in this encounter.   Labs/procedures today: none  Treatment Plan:  Continue with current plan  Reviewed: Term labor symptoms and general obstetric precautions including but not limited to vaginal bleeding, contractions, leaking of fluid and fetal movement were reviewed in detail with the patient.  All questions were answered.  Follow-up: Return in about 1 week (around 12/10/2020) for Return OB visit.  No orders of the defined types were placed in this encounter.  Raelyn Mora MSN, CNM 12/03/2020 2:13 PM

## 2020-12-10 ENCOUNTER — Other Ambulatory Visit: Payer: Self-pay

## 2020-12-10 ENCOUNTER — Ambulatory Visit (INDEPENDENT_AMBULATORY_CARE_PROVIDER_SITE_OTHER): Payer: Medicaid Other | Admitting: Advanced Practice Midwife

## 2020-12-10 VITALS — BP 112/76 | HR 101 | Wt 152.0 lb

## 2020-12-10 DIAGNOSIS — R12 Heartburn: Secondary | ICD-10-CM

## 2020-12-10 DIAGNOSIS — Z789 Other specified health status: Secondary | ICD-10-CM

## 2020-12-10 DIAGNOSIS — Z3480 Encounter for supervision of other normal pregnancy, unspecified trimester: Secondary | ICD-10-CM

## 2020-12-10 DIAGNOSIS — O26893 Other specified pregnancy related conditions, third trimester: Secondary | ICD-10-CM

## 2020-12-10 MED ORDER — FAMOTIDINE 40 MG PO TABS: 40.0000 mg | ORAL_TABLET | Freq: Every day | ORAL | 2 refills | Status: DC

## 2020-12-10 MED ORDER — PANTOPRAZOLE SODIUM 40 MG PO TBEC: 40.0000 mg | DELAYED_RELEASE_TABLET | Freq: Every day | ORAL | 0 refills | Status: DC

## 2020-12-10 NOTE — Progress Notes (Signed)
   PRENATAL VISIT NOTE  Subjective:  Jill [name] is a 40 y.o. [phone] at [redacted]w[redacted]d being seen today for ongoing prenatal care.  She is currently monitored for the following issues for this low-risk pregnancy and has Foreign body (FB) in soft tissue; Non-English speaking patient; Supervision of other normal pregnancy, antepartum; AMA (advanced maternal age) multigravida 35+; Unwanted fertility; and Thrombocytopenia affecting pregnancy (HCC) on their problem list.  Patient reports heartburn.  Contractions: Not present. Vag. Bleeding: None.  Movement: Present. Denies leaking of fluid.   The following portions of the patient's history were reviewed and updated as appropriate: allergies, current medications, past family history, past medical history, past social history, past surgical history and problem list.   Objective:   Vitals:   12/10/20 1545  BP: 112/76  Pulse: (!) 101  Weight: 152 lb (68.9 kg)    Fetal Status: Fetal Heart Rate (bpm): 134   Movement: Present     General:  Alert, oriented and cooperative. Patient is in no acute distress.  Skin: Skin is warm and dry. No rash noted.   Cardiovascular: Normal heart rate noted  Respiratory: Normal respiratory effort, no problems with respiration noted  Abdomen: Soft, gravid, appropriate for gestational age.  Pain/Pressure: Absent     Pelvic: Cervical exam deferred        Extremities: Normal range of motion.  Edema: None  Mental Status: Normal mood and affect. Normal behavior. Normal judgment and thought content.   Assessment and Plan:  Pregnancy: G4P3003 at 108w5d  1. Heartburn during pregnancy in third trimester --Pt with significant heartburn daily, keeps her up at night. She is taking daily Pepcid as prescribed. --Add PPI, Protonix daily in the am and continue Pepcid at night. --If heartburn is improved at visit next week, consider dropping Pepcid dose but Ok to continue both for short course --Pt had this last pregnancy too and  resolved completely at delivery  - famotidine (PEPCID) 40 MG tablet; Take 1 tablet (40 mg total) by mouth at bedtime.  Dispense: 30 tablet; Refill: 2 - pantoprazole (PROTONIX) 40 MG tablet; Take 1 tablet (40 mg total) by mouth daily before breakfast.  Dispense: 30 tablet; Refill: 0  2. Language barrier affecting health care --[name] video interpreter used for all communication  3. Supervision of other normal pregnancy, antepartum --Anticipatory guidance about next visits/weeks of pregnancy given. --Next visit in 1 week in office  Term labor symptoms and general obstetric precautions including but not limited to vaginal bleeding, contractions, leaking of fluid and fetal movement were reviewed in detail with the patient. Please refer to After Visit Summary for other counseling recommendations.   No follow-ups on file.  No future appointments.  Sharen Counter, CNM

## 2020-12-10 NOTE — Progress Notes (Signed)
ROB  38w 5d  CC: None and Declines cervix check.

## 2020-12-17 ENCOUNTER — Ambulatory Visit (INDEPENDENT_AMBULATORY_CARE_PROVIDER_SITE_OTHER): Payer: Medicaid Other | Admitting: Advanced Practice Midwife

## 2020-12-17 ENCOUNTER — Other Ambulatory Visit: Payer: Self-pay

## 2020-12-17 VITALS — BP 113/75 | HR 106 | Wt 152.0 lb

## 2020-12-17 DIAGNOSIS — R12 Heartburn: Secondary | ICD-10-CM

## 2020-12-17 DIAGNOSIS — O99119 Other diseases of the blood and blood-forming organs and certain disorders involving the immune mechanism complicating pregnancy, unspecified trimester: Secondary | ICD-10-CM

## 2020-12-17 DIAGNOSIS — Z789 Other specified health status: Secondary | ICD-10-CM

## 2020-12-17 DIAGNOSIS — O26893 Other specified pregnancy related conditions, third trimester: Secondary | ICD-10-CM

## 2020-12-17 DIAGNOSIS — D696 Thrombocytopenia, unspecified: Secondary | ICD-10-CM

## 2020-12-17 DIAGNOSIS — Z3480 Encounter for supervision of other normal pregnancy, unspecified trimester: Secondary | ICD-10-CM

## 2020-12-17 NOTE — Progress Notes (Signed)
+   Fetal movement. No complaints.  

## 2020-12-17 NOTE — Patient Instructions (Signed)
Labor Precautions Reasons to come to MAU at [facility]:  1.  Contractions are  5 minutes apart or less, each last 1 minute, these have been going on for 1-2 hours, and you cannot walk or talk during them 2.  You have a large gush of fluid, or a trickle of fluid that will not stop and you have to wear a pad 3.  You have bleeding that is bright red, heavier than spotting--like menstrual bleeding (spotting can be normal in early labor or after a check of your cervix) 4.  You do not feel the baby moving like he/she normally does  

## 2020-12-17 NOTE — Progress Notes (Signed)
   PRENATAL VISIT NOTE  Subjective:  Jill [name] is a 40 y.o. [phone] at [redacted]w[redacted]d being seen today for ongoing prenatal care.  She is currently monitored for the following issues for this low-risk pregnancy and has Foreign body (FB) in soft tissue; Non-English speaking patient; Supervision of other normal pregnancy, antepartum; AMA (advanced maternal age) multigravida 35+; Unwanted fertility; and Thrombocytopenia affecting pregnancy (HCC) on their problem list.  Patient reports occasional contractions.  Contractions: Irregular. Vag. Bleeding: None.  Movement: Present. Denies leaking of fluid.   The following portions of the patient's history were reviewed and updated as appropriate: allergies, current medications, past family history, past medical history, past social history, past surgical history and problem list.   Objective:   Vitals:   12/17/20 1440  BP: 113/75  Pulse: (!) 106  Weight: 152 lb (68.9 kg)    Fetal Status: Fetal Heart Rate (bpm): 134   Movement: Present     General:  Alert, oriented and cooperative. Patient is in no acute distress.  Skin: Skin is warm and dry. No rash noted.   Cardiovascular: Normal heart rate noted  Respiratory: Normal respiratory effort, no problems with respiration noted  Abdomen: Soft, gravid, appropriate for gestational age.  Pain/Pressure: Absent     Pelvic: Cervical exam deferred        Extremities: Normal range of motion.  Edema: None  Mental Status: Normal mood and affect. Normal behavior. Normal judgment and thought content.   Assessment and Plan:  Pregnancy: G4P3003 at [redacted]w[redacted]d 1. Language barrier affecting health care --[name] interpreter used for all communication  2. Supervision of other normal pregnancy, antepartum --Anticipatory guidance about next visits/weeks of pregnancy given. --Discussed labor readiness, labor precautions/reasons to come to [facility] --Offered membrane sweep today, pt declined --Next appt in office in 1 week, NST for  post term  3. Thrombocytopenia affecting pregnancy (HCC) --Stable  4. Heartburn during pregnancy in third trimester --Occasional now, improved from earlier  Term labor symptoms and general obstetric precautions including but not limited to vaginal bleeding, contractions, leaking of fluid and fetal movement were reviewed in detail with the patient. Please refer to After Visit Summary for other counseling recommendations.   No follow-ups on file.  No future appointments.  Sharen Counter, CNM

## 2020-12-24 ENCOUNTER — Encounter: Payer: Self-pay | Admitting: Obstetrics and Gynecology

## 2020-12-24 ENCOUNTER — Ambulatory Visit (INDEPENDENT_AMBULATORY_CARE_PROVIDER_SITE_OTHER): Payer: Medicaid Other | Admitting: Obstetrics and Gynecology

## 2020-12-24 ENCOUNTER — Telehealth (HOSPITAL_COMMUNITY): Payer: Self-pay | Admitting: *Deleted

## 2020-12-24 ENCOUNTER — Other Ambulatory Visit: Payer: Self-pay

## 2020-12-24 VITALS — BP 114/76 | HR 100 | Wt 151.0 lb

## 2020-12-24 DIAGNOSIS — O480 Post-term pregnancy: Secondary | ICD-10-CM

## 2020-12-24 DIAGNOSIS — O09523 Supervision of elderly multigravida, third trimester: Secondary | ICD-10-CM

## 2020-12-24 DIAGNOSIS — Z3480 Encounter for supervision of other normal pregnancy, unspecified trimester: Secondary | ICD-10-CM

## 2020-12-24 DIAGNOSIS — Z789 Other specified health status: Secondary | ICD-10-CM

## 2020-12-24 NOTE — Progress Notes (Signed)
+   Fetal movement. No complaints.  

## 2020-12-24 NOTE — Telephone Encounter (Signed)
Interpreter number [phone]

## 2020-12-24 NOTE — Progress Notes (Signed)
   PRENATAL VISIT NOTE  Subjective:  Jill [name] is a 40 y.o. [phone] at [redacted]w[redacted]d being seen today for ongoing prenatal care.  She is currently monitored for the following issues for this low-risk pregnancy and has Foreign body (FB) in soft tissue; Non-English speaking patient; Supervision of other normal pregnancy, antepartum; AMA (advanced maternal age) multigravida 35+; Unwanted fertility; and Thrombocytopenia affecting pregnancy (HCC) on their problem list.  Patient reports no complaints.  Contractions: Not present. Vag. Bleeding: None.  Movement: Present. Denies leaking of fluid.   The following portions of the patient's history were reviewed and updated as appropriate: allergies, current medications, past family history, past medical history, past social history, past surgical history and problem list.   Objective:   Vitals:   12/24/20 0853  BP: 114/76  Pulse: 100  Weight: 151 lb (68.5 kg)    Fetal Status: Fetal Heart Rate (bpm): 136   Movement: Present     General:  Alert, oriented and cooperative. Patient is in no acute distress.  Skin: Skin is warm and dry. No rash noted.   Cardiovascular: Normal heart rate noted  Respiratory: Normal respiratory effort, no problems with respiration noted  Abdomen: Soft, gravid, appropriate for gestational age.  Pain/Pressure: Absent     Pelvic: Cervical exam deferred        Extremities: Normal range of motion.  Edema: None  Mental Status: Normal mood and affect. Normal behavior. Normal judgment and thought content.   Assessment and Plan:  Pregnancy: G4P3003 at [redacted]w[redacted]d 1. Post-term pregnancy, 40-42 weeks of gestation Postdate NST reviewed and reactive with baseline 130, mod variability, +accels, no decels Discussed IOL at 41 weeks. Patient very reluctant to be induced  2. Supervision of other normal pregnancy, antepartum Patient is doing well without complaints  3. Multigravida of advanced maternal age in third trimester   4. Non-English  speaking patient Jill [name] interpreter used  Term labor symptoms and general obstetric precautions including but not limited to vaginal bleeding, contractions, leaking of fluid and fetal movement were reviewed in detail with the patient. Please refer to After Visit Summary for other counseling recommendations.   Return in about 6 weeks (around 02/04/2021) for postpartum.  Future Appointments  Date Time Provider Department Center  12/26/2020  9:05 AM [facility] SCHED ROOM [facility] None    Catalina Antigua, MD

## 2020-12-25 ENCOUNTER — Inpatient Hospital Stay (HOSPITAL_COMMUNITY): Payer: Medicaid Other | Admitting: Anesthesiology

## 2020-12-25 ENCOUNTER — Inpatient Hospital Stay (HOSPITAL_COMMUNITY)
Admission: RE | Admit: 2020-12-25 | Discharge: 2020-12-27 | DRG: 798 | Disposition: A | Discharge status: Home / Self Care | Payer: Medicaid Other | Attending: Family Medicine | Admitting: Family Medicine

## 2020-12-25 ENCOUNTER — Encounter (HOSPITAL_COMMUNITY): Payer: Self-pay | Admitting: Obstetrics & Gynecology

## 2020-12-25 ENCOUNTER — Other Ambulatory Visit: Payer: Self-pay

## 2020-12-25 ENCOUNTER — Other Ambulatory Visit (HOSPITAL_COMMUNITY): Payer: Medicaid Other | Attending: Obstetrics and Gynecology

## 2020-12-25 ENCOUNTER — Encounter (HOSPITAL_COMMUNITY)
Admission: RE | Disposition: A | Discharge status: Home / Self Care | Payer: Self-pay | Source: Home / Self Care | Attending: Family Medicine

## 2020-12-25 DIAGNOSIS — O09529 Supervision of elderly multigravida, unspecified trimester: Secondary | ICD-10-CM

## 2020-12-25 DIAGNOSIS — Z3A40 40 weeks gestation of pregnancy: Secondary | ICD-10-CM

## 2020-12-25 DIAGNOSIS — O99119 Other diseases of the blood and blood-forming organs and certain disorders involving the immune mechanism complicating pregnancy, unspecified trimester: Secondary | ICD-10-CM | POA: Diagnosis present

## 2020-12-25 DIAGNOSIS — Z9851 Tubal ligation status: Secondary | ICD-10-CM

## 2020-12-25 DIAGNOSIS — D696 Thrombocytopenia, unspecified: Secondary | ICD-10-CM | POA: Diagnosis not present

## 2020-12-25 DIAGNOSIS — Z3009 Encounter for other general counseling and advice on contraception: Secondary | ICD-10-CM | POA: Diagnosis present

## 2020-12-25 DIAGNOSIS — Z20822 Contact with and (suspected) exposure to covid-19: Secondary | ICD-10-CM | POA: Diagnosis present

## 2020-12-25 DIAGNOSIS — Z3480 Encounter for supervision of other normal pregnancy, unspecified trimester: Secondary | ICD-10-CM

## 2020-12-25 DIAGNOSIS — Z302 Encounter for sterilization: Secondary | ICD-10-CM | POA: Diagnosis not present

## 2020-12-25 DIAGNOSIS — D6959 Other secondary thrombocytopenia: Secondary | ICD-10-CM | POA: Diagnosis present

## 2020-12-25 DIAGNOSIS — Z789 Other specified health status: Secondary | ICD-10-CM | POA: Diagnosis present

## 2020-12-25 DIAGNOSIS — O9912 Other diseases of the blood and blood-forming organs and certain disorders involving the immune mechanism complicating childbirth: Principal | ICD-10-CM | POA: Diagnosis present

## 2020-12-25 DIAGNOSIS — O26893 Other specified pregnancy related conditions, third trimester: Secondary | ICD-10-CM | POA: Diagnosis not present

## 2020-12-25 DIAGNOSIS — O480 Post-term pregnancy: Secondary | ICD-10-CM | POA: Diagnosis not present

## 2020-12-25 HISTORY — PX: TUBAL LIGATION: SHX77

## 2020-12-25 LAB — CBC
HCT: 36.4 % (ref 36.0–46.0)
Hemoglobin: 12.3 g/dL (ref 12.0–15.0)
MCH: 31.6 pg (ref 26.0–34.0)
MCHC: 33.8 g/dL (ref 30.0–36.0)
MCV: 93.6 fL (ref 80.0–100.0)
Platelets: 123 10*3/uL — ABNORMAL LOW (ref 150–400)
RBC: 3.89 MIL/uL (ref 3.87–5.11)
RDW: 13.8 % (ref 11.5–15.5)
WBC: 9 10*3/uL (ref 4.0–10.5)
nRBC: 0 % (ref 0.0–0.2)

## 2020-12-25 LAB — RESP PANEL BY RT-PCR (FLU A&B, COVID) ARPGX2
Influenza A by PCR: NEGATIVE
Influenza B by PCR: NEGATIVE
SARS Coronavirus 2 by RT PCR: NEGATIVE

## 2020-12-25 LAB — RPR: RPR Ser Ql: NONREACTIVE

## 2020-12-25 LAB — TYPE AND SCREEN
ABO/RH(D): B POS
Antibody Screen: NEGATIVE

## 2020-12-25 SURGERY — LIGATION, FALLOPIAN TUBE, POSTPARTUM
Anesthesia: Spinal

## 2020-12-25 MED ORDER — OXYTOCIN-SODIUM CHLORIDE 30-0.9 UT/500ML-% IV SOLN
2.5000 [IU]/h | INTRAVENOUS | Status: DC
  Filled 2020-12-25: qty 500

## 2020-12-25 MED ORDER — IBUPROFEN 600 MG PO TABS
600.0000 mg | ORAL_TABLET | Freq: Four times a day (QID) | ORAL | Status: DC
  Administered 2020-12-26 – 2020-12-27 (×7): 600 mg via ORAL
  Filled 2020-12-25 (×7): qty 1

## 2020-12-25 MED ORDER — SOD CITRATE-CITRIC ACID 500-334 MG/5ML PO SOLN: 30.0000 mL | ORAL | Status: DC | PRN

## 2020-12-25 MED ORDER — TETANUS-DIPHTH-ACELL PERTUSSIS 5-2.5-18.5 LF-MCG/0.5 IM SUSY
0.5000 mL | PREFILLED_SYRINGE | Freq: Once | INTRAMUSCULAR | Status: DC
Start: 2020-12-26 — End: 2020-12-27

## 2020-12-25 MED ORDER — BUPIVACAINE IN DEXTROSE 0.75-8.25 % IT SOLN
INTRATHECAL | Status: DC | PRN
  Administered 2020-12-25: 1.4 mL via INTRATHECAL

## 2020-12-25 MED ORDER — DIPHENHYDRAMINE HCL 50 MG/ML IJ SOLN: 12.5000 mg | INTRAMUSCULAR | Status: DC | PRN

## 2020-12-25 MED ORDER — ONDANSETRON HCL 4 MG/2ML IJ SOLN
INTRAMUSCULAR | Status: AC
  Filled 2020-12-25: qty 2

## 2020-12-25 MED ORDER — ONDANSETRON HCL 4 MG/2ML IJ SOLN: 4.0000 mg | Freq: Four times a day (QID) | INTRAMUSCULAR | Status: DC | PRN

## 2020-12-25 MED ORDER — FENTANYL CITRATE (PF) 100 MCG/2ML IJ SOLN
INTRAMUSCULAR | Status: AC
  Filled 2020-12-25: qty 2

## 2020-12-25 MED ORDER — SIMETHICONE 80 MG PO CHEW: 80.0000 mg | CHEWABLE_TABLET | ORAL | Status: DC | PRN

## 2020-12-25 MED ORDER — LACTATED RINGERS IV SOLN: 500.0000 mL | Freq: Once | INTRAVENOUS | Status: DC

## 2020-12-25 MED ORDER — STERILE WATER FOR IRRIGATION IR SOLN
Status: DC | PRN
  Administered 2020-12-25: 1000 mL

## 2020-12-25 MED ORDER — PHENYLEPHRINE 40 MCG/ML (10ML) SYRINGE FOR IV PUSH (FOR BLOOD PRESSURE SUPPORT): 80.0000 ug | PREFILLED_SYRINGE | INTRAVENOUS | Status: DC | PRN

## 2020-12-25 MED ORDER — ACETAMINOPHEN 325 MG PO TABS: 650.0000 mg | ORAL_TABLET | ORAL | Status: DC | PRN

## 2020-12-25 MED ORDER — LACTATED RINGERS IV SOLN: INTRAVENOUS | Status: DC

## 2020-12-25 MED ORDER — OXYCODONE-ACETAMINOPHEN 5-325 MG PO TABS: 2.0000 | ORAL_TABLET | ORAL | Status: DC | PRN

## 2020-12-25 MED ORDER — OXYCODONE HCL 5 MG/5ML PO SOLN: 5.0000 mg | Freq: Once | ORAL | Status: DC | PRN

## 2020-12-25 MED ORDER — LIDOCAINE HCL (PF) 1 % IJ SOLN: 30.0000 mL | INTRAMUSCULAR | Status: DC | PRN

## 2020-12-25 MED ORDER — HYDROMORPHONE HCL 1 MG/ML IJ SOLN: 0.2500 mg | INTRAMUSCULAR | Status: DC | PRN

## 2020-12-25 MED ORDER — EPHEDRINE 5 MG/ML INJ: 10.0000 mg | INTRAVENOUS | Status: DC | PRN

## 2020-12-25 MED ORDER — PHENYLEPHRINE 40 MCG/ML (10ML) SYRINGE FOR IV PUSH (FOR BLOOD PRESSURE SUPPORT)
PREFILLED_SYRINGE | INTRAVENOUS | Status: AC
  Filled 2020-12-25: qty 10

## 2020-12-25 MED ORDER — PRENATAL MULTIVITAMIN CH
1.0000 | ORAL_TABLET | Freq: Every day | ORAL | Status: DC
  Administered 2020-12-26: 1 via ORAL
  Filled 2020-12-25: qty 1

## 2020-12-25 MED ORDER — SODIUM CHLORIDE 0.9 % IR SOLN
Status: DC | PRN
Start: 2020-12-25 — End: 2020-12-25
  Administered 2020-12-25: 1

## 2020-12-25 MED ORDER — BENZOCAINE-MENTHOL 20-0.5 % EX AERO: 1.0000 "application " | INHALATION_SPRAY | CUTANEOUS | Status: DC | PRN

## 2020-12-25 MED ORDER — ONDANSETRON HCL 4 MG/2ML IJ SOLN: 4.0000 mg | INTRAMUSCULAR | Status: DC | PRN

## 2020-12-25 MED ORDER — MEASLES, MUMPS & RUBELLA VAC IJ SOLR: 0.5000 mL | Freq: Once | INTRAMUSCULAR | Status: DC

## 2020-12-25 MED ORDER — DEXAMETHASONE SODIUM PHOSPHATE 10 MG/ML IJ SOLN
INTRAMUSCULAR | Status: AC
  Filled 2020-12-25: qty 1

## 2020-12-25 MED ORDER — BUPIVACAINE HCL (PF) 0.25 % IJ SOLN
INTRAMUSCULAR | Status: AC
  Filled 2020-12-25: qty 30

## 2020-12-25 MED ORDER — ONDANSETRON HCL 4 MG/2ML IJ SOLN
INTRAMUSCULAR | Status: DC | PRN
  Administered 2020-12-25: 4 mg via INTRAVENOUS

## 2020-12-25 MED ORDER — LACTATED RINGERS IV SOLN: 500.0000 mL | INTRAVENOUS | Status: DC | PRN

## 2020-12-25 MED ORDER — OXYTOCIN BOLUS FROM INFUSION: 333.0000 mL | Freq: Once | INTRAVENOUS | Status: DC

## 2020-12-25 MED ORDER — FENTANYL-BUPIVACAINE-NACL 0.5-0.125-0.9 MG/250ML-% EP SOLN: 12.0000 mL/h | EPIDURAL | Status: DC | PRN

## 2020-12-25 MED ORDER — OXYCODONE HCL 5 MG PO TABS: 5.0000 mg | ORAL_TABLET | ORAL | Status: DC | PRN

## 2020-12-25 MED ORDER — OXYTOCIN-SODIUM CHLORIDE 30-0.9 UT/500ML-% IV SOLN
1.0000 m[IU]/min | INTRAVENOUS | Status: DC
  Administered 2020-12-25: 2 m[IU]/min via INTRAVENOUS

## 2020-12-25 MED ORDER — OXYCODONE-ACETAMINOPHEN 5-325 MG PO TABS
1.0000 | ORAL_TABLET | ORAL | Status: DC | PRN
Start: 2020-12-25 — End: 2020-12-25

## 2020-12-25 MED ORDER — FENTANYL CITRATE (PF) 100 MCG/2ML IJ SOLN
INTRAMUSCULAR | Status: DC | PRN
  Administered 2020-12-25: 15 ug via INTRATHECAL

## 2020-12-25 MED ORDER — WITCH HAZEL-GLYCERIN EX PADS: 1.0000 "application " | MEDICATED_PAD | CUTANEOUS | Status: DC | PRN

## 2020-12-25 MED ORDER — OXYCODONE HCL 5 MG PO TABS: 5.0000 mg | ORAL_TABLET | Freq: Once | ORAL | Status: DC | PRN

## 2020-12-25 MED ORDER — DIPHENHYDRAMINE HCL 25 MG PO CAPS: 25.0000 mg | ORAL_CAPSULE | Freq: Four times a day (QID) | ORAL | Status: DC | PRN

## 2020-12-25 MED ORDER — PROMETHAZINE HCL 25 MG/ML IJ SOLN: 6.2500 mg | INTRAMUSCULAR | Status: DC | PRN

## 2020-12-25 MED ORDER — SENNOSIDES-DOCUSATE SODIUM 8.6-50 MG PO TABS
2.0000 | ORAL_TABLET | ORAL | Status: DC
Start: 2020-12-26 — End: 2020-12-27
  Administered 2020-12-26 – 2020-12-27 (×2): 2 via ORAL
  Filled 2020-12-25 (×2): qty 2

## 2020-12-25 MED ORDER — ZOLPIDEM TARTRATE 5 MG PO TABS: 5.0000 mg | ORAL_TABLET | Freq: Every evening | ORAL | Status: DC | PRN

## 2020-12-25 MED ORDER — DIBUCAINE (PERIANAL) 1 % EX OINT: 1.0000 "application " | TOPICAL_OINTMENT | CUTANEOUS | Status: DC | PRN

## 2020-12-25 MED ORDER — FENTANYL CITRATE (PF) 100 MCG/2ML IJ SOLN: 100.0000 ug | INTRAMUSCULAR | Status: DC | PRN

## 2020-12-25 MED ORDER — ACETAMINOPHEN 325 MG PO TABS
650.0000 mg | ORAL_TABLET | ORAL | Status: DC | PRN
  Administered 2020-12-25 – 2020-12-26 (×4): 650 mg via ORAL
  Filled 2020-12-25 (×4): qty 2

## 2020-12-25 MED ORDER — COCONUT OIL OIL: 1.0000 "application " | TOPICAL_OIL | Status: DC | PRN

## 2020-12-25 MED ORDER — LACTATED RINGERS IV SOLN: INTRAVENOUS | Status: DC | PRN

## 2020-12-25 MED ORDER — ONDANSETRON HCL 4 MG PO TABS: 4.0000 mg | ORAL_TABLET | ORAL | Status: DC | PRN

## 2020-12-25 MED ORDER — TERBUTALINE SULFATE 1 MG/ML IJ SOLN: 0.2500 mg | Freq: Once | INTRAMUSCULAR | Status: DC | PRN

## 2020-12-25 MED ORDER — BUPIVACAINE HCL (PF) 0.25 % IJ SOLN
INTRAMUSCULAR | Status: DC | PRN
  Administered 2020-12-25: 30 mL

## 2020-12-25 MED ORDER — PHENYLEPHRINE HCL-NACL 20-0.9 MG/250ML-% IV SOLN
INTRAVENOUS | Status: AC
  Filled 2020-12-25: qty 250

## 2020-12-25 MED ORDER — KETOROLAC TROMETHAMINE 30 MG/ML IJ SOLN: 30.0000 mg | Freq: Once | INTRAMUSCULAR | Status: DC | PRN

## 2020-12-25 SURGICAL SUPPLY — 22 items
BLADE SURG 11 STRL SS (BLADE) ×2 IMPLANT
CLIP FILSHIE TUBAL LIGA STRL (Clip) ×1 IMPLANT
DRSG OPSITE POSTOP 3X4 (GAUZE/BANDAGES/DRESSINGS) ×2 IMPLANT
DURAPREP 26ML APPLICATOR (WOUND CARE) ×2 IMPLANT
GLOVE BIOGEL PI IND STRL 7.0 (GLOVE) ×1 IMPLANT
GLOVE BIOGEL PI IND STRL 7.5 (GLOVE) ×1 IMPLANT
GLOVE BIOGEL PI INDICATOR 7.0 (GLOVE) ×1
GLOVE BIOGEL PI INDICATOR 7.5 (GLOVE) ×1
GLOVE ECLIPSE 7.5 STRL STRAW (GLOVE) ×2 IMPLANT
GOWN STRL REUS W/TWL LRG LVL3 (GOWN DISPOSABLE) ×4 IMPLANT
HIBICLENS CHG 4% 4OZ BTL (MISCELLANEOUS) ×2 IMPLANT
LIGASURE IMPACT 36 18CM CVD LR (INSTRUMENTS) ×2 IMPLANT
NEEDLE HYPO 22GX1.5 SAFETY (NEEDLE) ×2 IMPLANT
NS IRRIG 1000ML POUR BTL (IV SOLUTION) ×2 IMPLANT
PACK ABDOMINAL MINOR (CUSTOM PROCEDURE TRAY) ×2 IMPLANT
PROTECTOR NERVE ULNAR (MISCELLANEOUS) ×2 IMPLANT
SPONGE LAP 4X18 RFD (DISPOSABLE) IMPLANT
SUT VICRYL 0 UR6 27IN ABS (SUTURE) ×2 IMPLANT
SUT VICRYL 4-0 PS2 18IN ABS (SUTURE) ×2 IMPLANT
SYR CONTROL 10ML LL (SYRINGE) ×2 IMPLANT
TOWEL OR 17X24 6PK STRL BLUE (TOWEL DISPOSABLE) ×4 IMPLANT
TRAY FOLEY W/BAG SLVR 14FR (SET/KITS/TRAYS/PACK) ×2 IMPLANT

## 2020-12-25 NOTE — Anesthesia Postprocedure Evaluation (Signed)
Anesthesia Post Note  Patient: Water quality scientist  Procedure(s) Performed: POST PARTUM TUBAL LIGATION (N/A )     Patient location during evaluation: PACU Anesthesia Type: Spinal Level of consciousness: awake and alert Pain management: pain level controlled Vital Signs Assessment: post-procedure vital signs reviewed and stable Respiratory status: spontaneous breathing, nonlabored ventilation and respiratory function stable Cardiovascular status: blood pressure returned to baseline and stable Postop Assessment: no apparent nausea or vomiting Anesthetic complications: no   No complications documented.  Last Vitals:  Vitals:   12/25/20 1715 12/25/20 1730  BP: 99/72 101/70  Pulse: 71 87  Resp: (!) 21 (!) 21  Temp:    SpO2: 100% 98%    Last Pain:  Vitals:   12/25/20 1715  TempSrc:   PainSc: 0-No pain   Pain Goal:    LLE Motor Response: No movement due to regional block (12/25/20 1715) LLE Sensation: Numbness (12/25/20 1715) RLE Motor Response: No movement due to regional block (12/25/20 1715) RLE Sensation: Numbness (12/25/20 1715)     Epidural/Spinal Function Cutaneous sensation: No Sensation (12/25/20 1700), Patient able to flex knees: No (12/25/20 1700), Patient able to lift hips off bed: No (12/25/20 1700), Back pain beyond tenderness at insertion site: No (12/25/20 1700), Progressively worsening motor and/or sensory loss: No (12/25/20 1700), Bowel and/or bladder incontinence post epidural: No (12/25/20 1700)  Lowella Curb

## 2020-12-25 NOTE — Anesthesia Preprocedure Evaluation (Signed)
Anesthesia Evaluation  Patient identified by MRN, date of birth, ID band Patient awake    Reviewed: Allergy & Precautions, NPO status , Patient's Chart, lab work & pertinent test results  Airway Mallampati: II  TM Distance: >3 FB Neck ROM: Full    Dental no notable dental hx.    Pulmonary neg pulmonary ROS,    Pulmonary exam normal breath sounds clear to auscultation       Cardiovascular negative cardio ROS Normal cardiovascular exam Rhythm:Regular Rate:Normal     Neuro/Psych negative neurological ROS  negative psych ROS   GI/Hepatic negative GI ROS, Neg liver ROS,   Endo/Other  negative endocrine ROS  Renal/GU negative Renal ROS  negative genitourinary   Musculoskeletal negative musculoskeletal ROS (+)   Abdominal   Peds negative pediatric ROS (+)  Hematology negative hematology ROS (+)   Anesthesia Other Findings Post Partum  Reproductive/Obstetrics negative OB ROS                             Anesthesia Physical Anesthesia Plan  ASA: II  Anesthesia Plan: Spinal   Post-op Pain Management:    Induction: Intravenous  PONV Risk Score and Plan: 2 and Ondansetron, Midazolam and Treatment may vary due to age or medical condition  Airway Management Planned: Natural Airway and Simple Face Mask  Additional Equipment:   Intra-op Plan:   Post-operative Plan:   Informed Consent: I have reviewed the patients History and Physical, chart, labs and discussed the procedure including the risks, benefits and alternatives for the proposed anesthesia with the patient or authorized representative who has indicated his/her understanding and acceptance.     Dental advisory given  Plan Discussed with: CRNA  Anesthesia Plan Comments:         Anesthesia Quick Evaluation

## 2020-12-25 NOTE — Progress Notes (Signed)
Intrepreter #[id number]( Ipad) used to explain admission paperwork.
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
## 2020-12-25 NOTE — Progress Notes (Signed)
Patient ID: Jill [name], female   DOB: [birth year], 40 y.o.   MRN: [id number]  Feeling more pain/pressure; breathing well w ctx  BP 115/77, P 88 FHR 130s, +accels, early variables w ctx Ctx q 2-25mins Cx 7-8/90/vtx -1  IUP@term  Active labor  Anticipate vag del soon  [name] [facility] 12/25/2020 11:53 AM

## 2020-12-25 NOTE — Transfer of Care (Signed)
Immediate Anesthesia Transfer of Care Note  Patient: Jill [name]  Procedure(s) Performed: POST PARTUM TUBAL LIGATION (N/A )  Patient Location: PACU  Anesthesia Type:Spinal  Level of Consciousness: awake  Airway & Oxygen Therapy: Patient Spontanous Breathing  Post-op Assessment: Report given to RN and Post -op Vital signs reviewed and stable  Post vital signs: Reviewed and stable  Last Vitals:  Vitals Value Taken Time  BP 92/70 12/25/20 1622  Temp    Pulse 84 12/25/20 1624  Resp 20 12/25/20 1624  SpO2 100 % 12/25/20 1624  Vitals shown include unvalidated device data.  Last Pain:  Vitals:   12/25/20 1501  TempSrc:   PainSc: 2          Complications: No complications documented.

## 2020-12-25 NOTE — Anesthesia Procedure Notes (Signed)
Spinal  Patient location during procedure: OB End time: 12/25/2020 3:37 PM Reason for block: surgical anesthesia Staffing Performed: other anesthesia staff  Anesthesiologist: Lowella Curb, MD Preanesthetic Checklist Completed: patient identified, IV checked, risks and benefits discussed, surgical consent, monitors and equipment checked, pre-op evaluation and timeout performed Spinal Block Patient position: sitting Prep: DuraPrep and site prepped and draped Patient monitoring: heart rate, cardiac monitor, continuous pulse ox and blood pressure Approach: midline Location: L3-4 Injection technique: single-shot Needle Needle type: Pencan  Needle gauge: 24 G Needle length: 10 cm Assessment Sensory level: T4 Events: CSF return Additional Notes SAB placed by SRNA under direct supervision

## 2020-12-25 NOTE — Progress Notes (Signed)
POSTPARTUM TUBAL LIGATION/PERMANENT STERILIZATION CONSENT Patient is currently s/p NSVD today. Patient desires permanent sterilization. Other reversible forms of contraception were discussed with patient; she declines all other modalities. Risks of procedure discussed with patient including but not limited to: risk of regret, permanence of method, bleeding, infection, injury to surrounding organs and need for additional procedures.  Failure risk of about 1% with increased risk of ectopic gestation if pregnancy occurs was also discussed with patient.  Also discussed possibility of post-tubal pain syndrome. The patient concurred with the proposed plan, giving informed written consent for the procedures.  Patient has been NPO since 06:30, she will remain NPO for procedure. Anesthesia and OR aware.  Preoperative prophylactic antibiotics and SCDs ordered on call to the OR.  To OR when ready.  A Park Liter, [phone] was used during the entire consent process.  Jen Mow, DO [facility] for [employer], [facility] 12/25/2020  3:17 PM

## 2020-12-25 NOTE — Discharge Summary (Signed)
Postpartum Discharge Summary  Date of Service updated 12/27/20     Patient Name: Jill [name] [facility] DOB: 11/05/1980 MRN: [id number]  Date of admission: 12/25/2020 Delivery date:12/25/2020  Delivering provider: Serita Grammes D  Date of discharge: 12/27/2020  Admitting diagnosis: Encounter for induction of labor [Z34.90] Intrauterine pregnancy: [redacted]w[redacted]d    Secondary diagnosis:  Active Problems:   Non-English speaking patient   Supervision of other normal pregnancy, antepartum   AMA (advanced maternal age) multigravida 35+   Unwanted fertility   [place] affecting [place]   History of bilateral tubal ligation   Vaginal delivery  Additional problems: none    Discharge diagnosis: Term Pregnancy Delivered                                              Post partum procedures:postpartum tubal ligation Augmentation: AROM and Pitocin Complications: None  Hospital course: Onset of Labor With Vaginal Delivery      40y.o. yo [id number] admitted in Latent Labor on 12/25/2020. Patient had an uncomplicated labor course, requiring AROM to achieve active labor and then 2nd stage Pitocin to facilitate pushing effort.  Membrane Rupture Time/Date: 8:58 AM ,12/25/2020   Delivery Method:Vaginal, Spontaneous  Episiotomy: None  Lacerations:  None  Patient had a postpartum course significant for having a ppBTL on PPD#0 which she tolerated well.  She is ambulating, tolerating a regular diet, passing flatus, and urinating well. Patient is discharged home in stable condition on 12/27/20.  Newborn Data: Birth date:12/25/2020  Birth time:2:07 PM  Gender:Female  Living status:Living  Apgars:9 ,10  Weight:4190 g (9lb 3.8oz)  Magnesium Sulfate received: No BMZ received: No Rhophylac:N/A MMR:N/A T-DaP:Given postpartum Flu: Yes Transfusion:No  Physical exam  Vitals:   12/26/20 0502 12/26/20 1505 12/26/20 2100 12/27/20 0511  BP: 93/64 98/72 98/65  103/74  Pulse: 73 69 77 80  Resp:  18 18 16 16   Temp: 98.2 F (36.8 C) 98.1 F (36.7 C) 98.3 F (36.8 C) 98 F (36.7 C)  TempSrc: Oral Oral Oral Oral  SpO2:   100% 100%  Height:       General: alert, cooperative and no distress Lochia: appropriate Uterine Fundus: firm Incision: Dressing is clean, dry, and intact DVT Evaluation: No evidence of DVT seen on physical exam. Labs: Lab Results  Component Value Date   WBC 9.0 12/25/2020   HGB 12.3 12/25/2020   HCT 36.4 12/25/2020   MCV 93.6 12/25/2020   PLT 123 (L) 12/25/2020   CMP Latest Ref Rng & Units 01/21/2015  Glucose 70 - 99 mg/dL 86  BUN 6 - 20 mg/dL 8  Creatinine 0.44 - 1.00 mg/dL 0.69  Sodium 135 - 145 mmol/L 137  Potassium 3.5 - 5.1 mmol/L 3.8  Chloride 101 - 111 mmol/L 108  CO2 22 - 32 mmol/L 20(L)  Calcium 8.9 - 10.3 mg/dL 8.1(L)  Total Protein 6.5 - 8.1 g/dL 5.6(L)  Total Bilirubin 0.3 - 1.2 mg/dL 1.0  Alkaline Phos 38 - 126 U/L 32(L)  AST 15 - 41 U/L 109(H)  ALT 14 - 54 U/L 132(H)   EdFlavia Shippercore: Edinburgh Postnatal Depression Scale Screening Tool 12/26/2020  I have been able to laugh and see the funny side of things. 0  I have looked forward with enjoyment to things. 0  I have blamed myself unnecessarily when things went wrong.  0  I have been anxious or worried for no good reason. 0  I have felt scared or panicky for no good reason. 0  Things have been getting on top of me. 0  I have been so unhappy that I have had difficulty sleeping. 2  I have felt sad or miserable. 1  I have been so unhappy that I have been crying. 0  The thought of harming myself has occurred to me. 0  Edinburgh Postnatal Depression Scale Total 3     After visit meds:  Allergies as of 12/27/2020   No Known Allergies     Medication List    STOP taking these medications   cyclobenzaprine 10 MG tablet Commonly known as: FLEXERIL   famotidine 40 MG tablet Commonly known as: Pepcid   pantoprazole 40 MG tablet Commonly known as: Protonix   promethazine 25 MG  tablet Commonly known as: PHENERGAN     TAKE these medications   acetaminophen 500 MG tablet Commonly known as: TYLENOL Take 2 tablets (1,000 mg total) by mouth every 6 (six) hours as needed (for pain scale < 4).   Blood Pressure Kit Devi 1 kit by Does not apply route once a week. Check Blood Pressure regularly and record readings into the Babyscripts App.  Large Cuff.  DX O90.0   coconut oil Oil Apply 1 application topically as needed.   Comfort Fit Maternity Supp Med Misc 1 Device by Does not apply route daily.   ibuprofen 600 MG tablet Commonly known as: ADVIL Take 1 tablet (600 mg total) by mouth every 6 (six) hours as needed.   Prenate Pixie 10-0.6-0.4-200 MG Caps Take 1 capsule by mouth daily.        Discharge home in stable condition Infant Feeding: Breast Infant Disposition:home with mother Discharge instruction: per After Visit Summary and Postpartum booklet. Activity: Advance as tolerated. Pelvic rest for 6 weeks.  Diet: routine diet Future Appointments: Future Appointments  Date Time Provider [place]  01/23/2021  9:15 AM Nugent, Gerrie Nordmann, NP [place] None   Follow up Visit:  Myrtis Ser, CNM  P [facility] Please schedule this patient for Postpartum visit in: 4 weeks with the following provider: Any provider  In-Person  For C/S patients schedule nurse incision check in weeks 2 weeks: no  Low risk pregnancy complicated by: lang barrier, thrombocytopenia, AMA  Delivery mode: SVD  Anticipated Birth Control: BTL done PP  PP Procedures needed: none  Schedule Integrated BH visit: no   7/34/0370 Arrie Senate, MD

## 2020-12-25 NOTE — Lactation Note (Signed)
This note was copied from a baby's chart. Lactation Consultation Note  Patient Name: Jill [name] QIWLN'L Date: 12/25/2020 Reason for consult: L&D Initial assessment;Other (Comment);Term (AMA, "Jill [name]" interpreter) Age:38 hours   Used [employer] interpreter services for "Jill [name]", interpreter ID # [phone]  Visited with mom of < 1 hour old FT female, she's a P3 and experienced BF. Offered assistance with latch but mom declined, she told LC she was too tired. When offered assistance with hand expression, mom reported that she's already familiar with that and demonstrated the ability to hand express to LC, small droplets of colostrum noted, praised her for her efforts.  Reviewed normal newborn behavior, feeding cues and size of baby's stomach. Mom doesn't have a pump at home and requested to be set up with a pump at the hospital. Offered both options a DEBP and a hand pump, she chooses the hand pump. Explained to mom that she'll be given a hand pump once she's taken to her MBU room.  Feeding plan:  1. Encouraged mom to feed baby 8-12 times/24 hours or sooner if feeding cues are present 2. Hand expression and spoon feeding were also encouraged 3. Mom has already requested formula, but she's being taken to the OR for a tubal ligation, she'll start supplementing baby once she's in the [facility] room  No literature provided due to the nature of this L&D consultation. No support person in the room at the time of [facility] consultation. Mom reported all questions and concerns were answered, she's aware of LC OP services and will call PRN.   Maternal Data Has patient been taught Hand Expression?: Yes  Feeding Mother's Current Feeding Choice: Breast Milk  LATCH Score                    Lactation Tools Discussed/Used    Interventions Interventions: Breast feeding basics reviewed;Hand express;Breast massage  Discharge    Consult Status Consult Status: Follow-up Date: 12/26/20 Follow-up type:  In-patient    Jill [name] 12/25/2020, 3:00 PM

## 2020-12-25 NOTE — MAU Note (Signed)
Pt reports contractions since yesterday, worsening today

## 2020-12-25 NOTE — Progress Notes (Signed)
Patient ID: Jill [name], female   DOB: 1981-04-13, 40 y.o.   MRN: [id number]  Through [name] interpreter ([employer]), pt states she is still having ctx but they are less frequent than before  BP 116/79, P 85 FHR 120s, +accels, no decels, Cat 1 Ctx q 4-6 mins Cx 5+/90/vtx -2; AROM for light MSF  IUP@40 .6wks Latent labor GBS neg  Hopeful that with AROM her labor will increase Anticipate vag del  [name] CNM 12/25/2020 9:05 AM

## 2020-12-25 NOTE — Op Note (Signed)
Operative Note   [employer]   SURGERY DATE: 12/25/2020  PRE-OP DIAGNOSIS: Multiparity, undesired Fertility  POST-OP DIAGNOSIS: Same   PROCEDURE: Postpartum bilateral tubal ligation using LigaSure  SURGEON: Surgeon(s) and Role:    * Levie Heritage, DO - Primary    * Eren Ryser, Hiram Comber, DO  ASSISTANT: Jen Mow, DO   ANESTHESIA: spinal  COMPLICATIONS:  None immediate.  ESTIMATED BLOOD LOSS:  Less than 5 ml.  FLUIDS: 1000 ml LR.  URINE OUTPUT:  250 ml of clear urine.  INDICATIONS: 40 y.o. [id number]  with undesired fertility,status post vaginal delivery, desires permanent sterilization. Risks and benefits of procedure discussed with patient including permanence of method, bleeding, infection, injury to surrounding organs and need for additional procedures. Risk failure of 0.5-1% with increased risk of ectopic gestation if pregnancy occurs was also discussed with patient.   FINDINGS:  Normal uterus, tubes, and ovaries.  SPECIMEN: Right and left fallopian tubes sent to pathology.  TECHNIQUE:  The patient was taken to the operating room where her epidural anesthesia was dosed up to surgical level and found to be adequate.  She was then placed in the dorsal supine position and prepped and draped in sterile fashion.  After an adequate timeout was performed, attention was turned to the patient's abdomen where a small transverse skin incision was made under the umbilical fold. The incision was taken down to the layer of fascia using the scalpel, and fascia was incised, and extended bilaterally using Mayo scissors. The peritoneum was entered in a sharp fashion. Attention was then turned to the patient's uterus. The right fallopian tube was lightly grasped with Babcock forceps, and LigaSure was used to ligate right fallopian tube. Hemostasis was noted. Attention was then turned to the left fallopian tube, and was lightly grasped with Babcock forceps, and LigaSure was used to ligate  left fallopian tube. Hemostasis was noted.   Good hemostasis was noted overall.  The instruments were then removed from the patient's abdomen and the fascial incision was repaired with 0 Vicryl, and the skin was closed with a 4-0 Monocryl subcuticular stitch. The patient tolerated the procedure well. 20cc 0.25% Marcaine was injected subcutaneously around incision site.  Sponge, lap, and needle counts were correct times two.  The patient was then taken to the recovery room awake, extubated and in stable condition.  Jen Mow, DO [facility] for [employer], [facility] 12/25/2020  4:19 PM

## 2020-12-26 ENCOUNTER — Inpatient Hospital Stay (HOSPITAL_COMMUNITY)
Admission: AD | Admit: 2020-12-26 | Payer: Medicaid Other | Source: Home / Self Care | Admitting: Obstetrics and Gynecology

## 2020-12-26 ENCOUNTER — Inpatient Hospital Stay (HOSPITAL_COMMUNITY): Payer: Medicaid Other

## 2020-12-26 NOTE — Lactation Note (Signed)
This note was copied from Jill baby's chart. Lactation Consultation Note  Patient Name: Jill [name] IFOYD'X Date: 12/26/2020 Reason for consult: Initial assessment;Mother's request;Term Age:38 hours  [name] interpreter: [employer] [phone]  Initial visit to 24 hours old infant of Jill P4 mother. Infant is sleeping in basinet upon arrival. Mother states she has breastfeed 3 children but has noticed that over time her milk supply has decreased. Mother reports using formula because she has no breast milk.   Talked to mother about hand expression, demonstrate technique, colostrum is easily expressed. RN provided hand pump. LC fitted mother with Jill 27 mm instead of Jill 24 mm flange. Colostrum is easily pump with hand pump. LC reviewed pumping frequency/storage and care of parts.   Discussed normal breast milk volume at 24h. Encouraged to latch infant, hand express and/or use hand pump for stimulation and supplementation. LC provided formula supplementation guidelines according to infant's age.   Feeding plan:  1-Skin to skin 2-Aim for Jill deep, comfortable latch 3-Breastfeeding on demand or 8-12 times in 24h period. 4-Keep infant awake during breastfeeding session: massaging breast, infant's hand/shoulder/feet 5-Pump or hand-express and offer EBM prior to supplementation. 6-If needed, supplement following guidelines, paced bottle feeding and fullness cues.  7-Monitor voids and stools as signs good intake.  8-Encouraged maternal rest, hydration and food intake.  9-Contact LC as needed for feeds/support/concerns/questions  All questions answered.   Maternal Data Has patient been taught Hand Expression?: Yes Does the patient have breastfeeding experience prior to this delivery?: Yes  Feeding Mother's Current Feeding Choice: Breast Milk and Formula Nipple Type: Slow - flow  Lactation Tools Discussed/Used Tools: Pump;Flanges Flange Size: 27 Breast pump type: Manual Pump Education: Setup, frequency,  and cleaning;Milk Storage Reason for Pumping: stimulation and supplementation Pumping frequency: as needed  Interventions Interventions: Breast feeding basics reviewed;Skin to skin;Breast massage;Hand express;Hand pump;Expressed milk;Education  Discharge Pump: Manual WIC Program: Yes  Consult Status Consult Status: Follow-up Date: 12/27/20 Follow-up type: In-patient    Jill [name] Jill [name] 12/26/2020, 2:15 PM

## 2020-12-26 NOTE — Progress Notes (Signed)
24hr screenings (CHD and PKU) discussed in full detail using Karen Interpreter Tabe # [id number].  Father and mother of infant at bedside verbalized understanding of the tests and all questions answered.  Also, mother understands she can call out for stronger pain medicine if needed.  She has had Tylenol and Ibuprofen alternating and her pain is rated a 4/10 (aching at incision).    Also, mother states she will attempt breastfeeding at home and it has been too challenging in the hospital.  LC saw couplet this afternoon. 

## 2020-12-26 NOTE — Progress Notes (Addendum)
POSTPARTUM PROGRESS NOTE  Post Partum Day 1  Subjective:  Jill [name] is a 40 y.o. [phone] s/p normal spontaneous vaginal delivery at [redacted]w[redacted]d and s/p BTL on 12/25/20. No acute events overnight. Pt denies problems with ambulating, voiding or po intake. She denies nausea or vomiting. Denies headache, dizziness. Pain is moderately controlled. She has had flatus. Lochia is minimal.  Objective: Blood pressure 93/64, pulse 73, temperature 98.2 F (36.8 C), temperature source Oral, resp. rate 18, height 4\' 10"  (1.473 m), last menstrual period 03/25/2020, SpO2 98 %.  Physical Exam:  General: alert, cooperative and no distress Chest: no respiratory distress Heart:regular rate Abdomen: soft, appropriately tender. BTL incision with honeycomb dressing with minimal dry blood. Dry without visible drainage. Uterine Fundus: firm, appropriately tender DVT Evaluation: SCDs in place. No calf swelling or tenderness Skin: warm, dry  Recent Labs    12/25/20 0645  HGB 12.3  HCT 36.4    Assessment/Plan: Jill [name] is a 40 y.o. 20 s/p NSVD at [redacted]w[redacted]d and s/p BTL.   PPD#1 - Doing well, meeting pp milestones, minimal pain Contraception: s/p BTL. Appropriate exam. Feeding: breast and pump Gestational Thrombocytopenia: 123 on 12/25/20; plan for repeat CBC at postpartum appointment in 4-6 weeks. Dispo: Plan for discharge tomorrow given baby needs to stay.  A 02/24/21 interpretor was used throughout visit   LOS: 1 day   [name], Medical Student 12/26/2020, 10:33 AM   Attestation of Supervision of Student:  I confirm that I have verified the information documented in the medical student's note and that I have also personally reperformed the history, physical exam and all medical decision making activities.  I have verified that all services and findings are accurately documented in this student's note; and I agree with management and plan as outlined in the documentation. I have also made any  necessary editorial changes.  12/28/2020, MD Center for [facility], [facility] 12/26/2020 10:56 AM

## 2020-12-27 DIAGNOSIS — Z9851 Tubal ligation status: Secondary | ICD-10-CM

## 2020-12-27 LAB — SURGICAL PATHOLOGY

## 2020-12-27 MED ORDER — COCONUT OIL OIL: 1.0000 "application " | TOPICAL_OIL | 0 refills | Status: DC | PRN

## 2020-12-27 MED ORDER — IBUPROFEN 600 MG PO TABS: 600.0000 mg | ORAL_TABLET | Freq: Four times a day (QID) | ORAL | 0 refills | Status: DC | PRN

## 2020-12-27 MED ORDER — ACETAMINOPHEN 500 MG PO TABS: 1000.0000 mg | ORAL_TABLET | Freq: Four times a day (QID) | ORAL | Status: DC | PRN

## 2020-12-27 NOTE — Discharge Instructions (Signed)
[phone] for questions/concerns  Ch?m [place] h?u s?n sau khi sinh th??ng Postpartum Care After Vaginal Delivery Thng tin sau ?y cung c?p h??ng d?n v? cch ch?m [place] b?n thn qu v? t? lc sinh con cho ??n 6-12 tu?n sau sinh (th?i k? h?u s?n). N?u qu v? c cc v?n ?? ho?c c th?c m?c, hy lin h? v?i chuyn gia ch?m [place] s?c kh?e ?? ???c h??ng d?n c? th? h?n. Tun th? nh?ng h??ng d?n ny ? nh: Ch?y mu m ??o  Ch?y mu m ??o (s?n d?ch) sau khi sinh l bnh th??ng. Hy mang b?ng v? sinh ?? th?m mu v d?ch ti?t. ? Trong tu?n ??u tin sau khi sinh, l??ng v mu s?c c?a s?n d?ch th??ng t??ng t? v?i kinh nguy?t. ? Trong vi tu?n ti?p theo, n s? gi?m d?n thnh d?ch ti?t kh, mu vng nu. ? ??i v?i h?u h?t ph? n?, s?n d?ch ng?ng hon ton sau sinh 4-6 tu?n, nh?ng c th? thay ??i.  Thay b?ng v? sinh th??ng xuyn. Theo di b?t c? thay ??i no v? vi?c ra mu, ch?ng h?n nh?: ? ??t ng?t t?ng l??ng mu ch?y ra. ? Thay ??i mu s?c. ? C?c mu ?ng l?n.  N?u quy? vi? ra m?t cu?c ma?u ?ng ? m ??o, ha?y gi?? n la?i v g?i cho chuyn gia ch?m so?c s??c kho?e. Khng xa? tri ca?c cu?c ma?u ?ng xu?ng b?n c?u ma? ch?a ni v?i chuyn gia ch?m so?c s??c kho?e.  Khng s?? du?ng nu?t g?c ho??c thu?t r??a cho ??n khi ???c chuyn gia ch?m so?c s??c kho?e ch?p thu?n.  N?u qu v? khng nui con b?ng s?a m?, qu v? s? c kinh nguy?t tr? l?i trong vng 6-8 tu?n sau khi sinh con. N?u qu v? ch? cho con b s?a m?, kinh nguy?t c th? khng c tr? l?i cho ??n khi qu v? ng?ng cho con b. Ch?m [place] vng ?y ch?u  Gi? cho vng gi?a m ??o v h?u mn (?y ch?u) s?ch v kh. S? d?ng b?ng t?m thu?c v thu?c x?t v kem c thu?c gi?m ?au theo ch? d?n.  N?u qu v? c v?t c?t ph?u thu?t ? ?y ch?u (c?t t?ng sinh mn) ho?c v?t rch, hy ki?m tra khu v?c ? xem c d?u hi?u nhi?m trng khng cho ??n khi qu v? lnh l?i. Ki?m tra xem c: ? ??, s?ng, ho?c ?au nhi?u h?n. ? D?ch ho?c mu ch?y ? v?t c?t ho?c v?t  rch. ? ?m. ? M? ho?c mi hi.  Qu v? c th? ???c cung c?p m?t bnh phun ?? s? d?ng thay v lau ?? lm s?ch vng ?y ch?u sau khi s? d?ng nh v? sinh. Th?m nh? nhng ?? lm kh vng ny.  ?? gi?m ?au do th? thu?t c?t t?ng sinh mn, v?t rch, ho?c s?ng t?nh m?ch trong h?u mn (b?nh tr?), hy ngm mng trong n??c ?m 2-3 l?n m?i ngy. Trong khi ngm mng, n??c ?m ch? c?n ng?p ??n hng v ng?p mng qu v?.   Ch?m [place] v  Trong vi ngy ??u tin sau sinh, v c?a qu v? c th? c?m gic n?ng, c?ng v c?m gic kh ch?u (c??ng s?a). S?a ? v c?ng c th? r? ra. Hy h?i chuyn gia ch?m [place] s?c kh?e v? cch gip gi?m c?m gic kh ch?u.  N?u quy? vi? ?ang cho con b: ? M??c a?o ng??c h? tr?? cho vu? va? v??a v??n v??i quy? vi?. S? d?ng mi?ng ??m  o ng?c ?? th?m s?a r? ra. ? Gi? cho nm v c?a qu v? s?ch v kh. Bi kem v thu?c m? theo ch? d?n. ? Qu v? c th? c cc c?n co t? cung m?i l?n cho con b cho ??n vi tu?n sau sinh. ?i?u ny gip t? cung c?a qu v? tr? l?i kch th??c bnh th??ng. ? N?u qu v? c b?t k? v?n ?? no v?i vi?c cho con b, hy thng bo cho chuyn gia ch?m [place] s?c kh?e ho?c chuyn gia t? v?n nui con b?ng s?a m?.  N?u qu v? khng cho con b: ? Trnh ch?m vo v. Khng v?t (n?n) s?a. Lm nh? v?y c th? khi?n v s?n xu?t nhi?u s?a h?n. ? M?c o ng?c v?a v?n v s? d?ng ti ch??m l?nh ?? gip gi?m s?ng. Quan h? thn m?t v tnh d?c  H?i chuyn gia ch?m so?c s??c kho?e cu?a quy? vi? v? vi?c khi na?o quy? vi? co? th? quan h? ti?nh du?c. ?i?u na?y c th? phu? thu?c va?o: ? Nguy c? nhi?m tru?ng c?a qu v?. ? T?c ?? lnh l?i c?a qu v?. ? C?m gic thoa?i ma?i va? ham mu?n quan h? ti?nh du?c c?a qu v?.  Qu v? c th? c [place] sau khi sinh con, ngay c? khi qu v? ch?a th?y kinh nguy?t. Hy trao ??i v?i chuyn gia ch?m [place] s?c kh?e v? cc ph??ng php ng?a thai (trnh [place]) ho?c k? ho?ch ha gia ?nh n?u qu v? mu?n mang thai sau ny. Thu?c  Ch? s? d?ng thu?c khng k ??n v  thu?c k ??n theo ch? d?n c?a chuyn gia ch?m [place] s?c kh?e.  Ch? s? d?ng thu?c lm m?m phn khng k ??n ?? gip d? dng ?i ??i ti?n theo ch? d?n c?a chuyn gia ch?m [place] s?c kh?e.  N?u qu v? ???c k ??n thu?c khng sinh, hy dng thu?c theo ch? d?n c?a chuyn gia ch?m [place] s?c kh?e. Khng d?ng s? d?ng thu?c khng sinh ngay c? khi qu v? b?t ??u c?m th?y ?? h?n.  Xem l?i t?t c? cc ??n thu?c tr??c ?y v hi?n t?i ?? ki?m tra kh? n?ng thu?c truy?n vo s?a m?. Ho?t ??ng  D?n tr? l?i sinh ho?t bnh th??ng theo ch? d?n c?a chuyn gia ch?m [place] s?c kh?e.  Ngh? ng?i cng nhi?u cng t?t. Ch?p m?t khi con qu v? ng?. ?n v u?ng  U?ng ?? n??c ?? gi? cho n??c ti?u c mu vng nh?t.  ?? gip ng?n ng?a ho?c gi?m to bn, hy ?n th?c ph?m ch?a nhi?u ch?t x? m?i ngy.  Ch?n ch? ?? ?n lnh m?nh ?? h? tr? cho con b ho?c m?c tiu gi?m cn.  U?ng vitamin tr??c khi sinh cho ??n khi chuyn gia ch?m [place] s?c kh?e ni qu v? d?ng l?i.   L?i khuyn/khuy?n ngh? chung  Khng s? d?ng b?t k? s?n ph?m no c nicotine ho?c thu?c l. Nh?ng s?n ph?m ny bao g?m thu?c l d?ng ht, thu?c l d?ng nhai v d?ng c? ht thu?c, ch?ng h?n nh? thu?c l ?i?n t?. N?u qu v? c?n gip ?? ?? cai thu?c, hy h?i chuyn gia ch?m [place] s?c kh?e.  Khng u?ng bia r??u, ??c bi?t l n?u qu v? ?ang cho con b.  Khng dng thu?c ho?c thu?c khng ???c k ??n cho qu v?, ??c bi?t l n?u qu v? ?ang cho con b.  G?p chuyn gia ch?m  s?c kh?e ?? khm s?c kh?e h?u s?n  trong vng 3-6 tu?n ??u sau khi sinh.  Hon thnh l?n khm sau sinh ton di?n khng mu?n h?n 12 tu?n sau khi sinh.  Tun th? t?t c? cc l?n khm theo di cho qu v? v con qu v?. Hy lin l?c v?i chuyn gia ch?m [place] s?c kh?e n?u:  Quy? vi? ca?m th?y bu?n ho??c lo l??ng b?t th???ng.  Vu? cu?a quy? vi? b? ??, ?au, ho?c c?ng.  Qu v? b? s?t ho?c cc d?u hi?u khc c?a nhi?m trng.  Qu v? b? ch?y mu lm ??t m?t b?ng v? sinh m?i gi? ho?c qu v? c c?c mu ?ng.  Qu v?  b? ?au ??u r?t nhi?u m khng kh?i ho?c qu v? b? thay ??i th? l?c.  Qu v? bu?n nn v nn i v khng th? ?n ho?c u?ng b?t c? th? g trong 24 gi?[place] c?u tr? gip ngay l?p t?c n?u:  Qu v? b? ?au ng?c ho?c kh th?.  Qu v? b? ?au chn r?t nhi?u, ??t ng?t.  Qu v? ng?t ho?c co gi?t.  Qu v? c  nghi? v? vi?c la?m t?n th??ng ba?n thn ho?c con quy? vi?. N?u qu v? ? t?ng c?m th?y nh? c th? t? lm th??ng t?n b?n thn ho?c ng??i khc, ho?c c suy ngh? v? vi?c t? t?, hy yu c?u tr? gip ngay l?p t?c. ??n phng c?p c?u g?n nh?t ho?c:  G?i cho d?ch v? c?p c?u t?i ??a ph??ng (911 ? [place]  National Suicide Prevention Lifeline (???ng Dy Ng?n Ch?n T? T? Qu?c Gia) theo s? [phone]. ???ng dy tr? gip kh?ng ho?ng t? t? ny ho?t ??ng 24 gi? m?t ngy.  G?i tin nh?n t?i Crisis Text Line (???ng dy kh?ng ho?ng) theo s? [phone] (t?i [place] Tm t?t  Kho?ng th?i gian sau khi qu v? sinh em b cho ??n 6-12 tu?n sau khi sinh ???c g?i l th?i k? h?u s?n.  Tun th? t?t c? cc l?n khm theo di cho qu v? v con qu v?.  Xem l?i t?t c? cc ??n thu?c tr??c ?y v hi?n t?i ?? ki?m tra kh? n?ng thu?c truy?n vo s?a m?.  Lin h? v?i chuyn gia ch?m [place] s?c kh?e n?u qu v? c?m th?y bu?n ho?c lo l?ng b?t th??ng trong th?i k? h?u s?n. Thng tin ny khng nh?m m?c ?ch thay th? cho l?i khuyn m chuyn gia ch?m [place] s?c kh?e ni v?i qu v?. Hy b?o ??m qu v? ph?i th?o lu?n b?t k? v?n ?? g m qu v? c v?i chuyn gia ch?m [place] s?c kh?e c?a qu v?. Document Revised: 06/22/2020 Document Reviewed: 06/13/2020 Elsevier Patient Education  2021 [employer].

## 2020-12-27 NOTE — Lactation Note (Signed)
This note was copied from a baby's chart. Lactation Consultation Note  Patient Name: Jill [name] KGURK'Y Date: 12/27/2020   Age:38 hours  K. Ranae Pila, RN said that 3rd shift nurse relayed that Mom said that she doesn't breastfeed until she goes home. Reginia Forts, RN reports that Mom is not using her hand pump.  No need for Lactation visit at this time.    Lurline Hare [facility] 12/27/2020, 9:16 AM

## 2021-01-23 ENCOUNTER — Other Ambulatory Visit: Payer: Self-pay

## 2021-01-23 ENCOUNTER — Encounter: Payer: Self-pay | Admitting: Women's Health

## 2021-01-23 ENCOUNTER — Ambulatory Visit (INDEPENDENT_AMBULATORY_CARE_PROVIDER_SITE_OTHER): Payer: Medicaid Other | Admitting: Women's Health

## 2021-01-23 NOTE — Progress Notes (Signed)
..    Post Partum Visit Note  Jill [name] is a 40 y.o. [id number] female who presents for a postpartum visit. She is 4 weeks postpartum following a normal spontaneous vaginal delivery.  I have fully reviewed the prenatal and intrapartum course. The delivery was at 40.6 gestational weeks.  Anesthesia: none. Postpartum course has been good. Baby is doing well. Baby is feeding by bottle Rush Barer. Bleeding no bleeding. Bowel function is normal. Bladder function is normal. Patient is not sexually active. Contraception method is tubal ligation. Postpartum depression screening: negative.   The pregnancy intention screening data noted above was reviewed. Potential methods of contraception were discussed. The patient elected to proceed with Female Sterilization.    Edinburgh Postnatal Depression Scale - 01/23/21 0925      Edinburgh Postnatal Depression Scale:  In the Past 7 Days   I have been able to laugh and see the funny side of things. 0    I have looked forward with enjoyment to things. 0    I have blamed myself unnecessarily when things went wrong. 0    I have been anxious or worried for no good reason. 0    I have felt scared or panicky for no good reason. 0    Things have been getting on top of me. 0    I have been so unhappy that I have had difficulty sleeping. 0    I have felt sad or miserable. 0    I have been so unhappy that I have been crying. 0    The thought of harming myself has occurred to me. 0    Edinburgh Postnatal Depression Scale Total 0           Health Maintenance Due  Topic Date Due  . COVID-19 Vaccine (3 - Booster for Moderna series) 09/28/2020  . PAP SMEAR-Modifier  02/03/2021    The following portions of the patient's history were reviewed and updated as appropriate: allergies, current medications, past family history, past medical history, past social history, past surgical history and problem list.  Review of Systems Pertinent items noted in HPI and remainder  of comprehensive ROS otherwise negative.  Objective:  BP 112/76   Pulse 67   Wt 130 lb 6.4 oz (59.1 kg)   LMP 03/25/2020   Breastfeeding No   BMI 27.25 kg/m    General:  alert, cooperative and no distress   Breasts:  not indicated  Lungs: clear to auscultation bilaterally  Heart:  regular rate and rhythm, S1, S2 normal, no murmur, click, rub or gallop  Abdomen: soft, non-tender; bowel sounds normal; no masses,  no organomegaly   Wound well approximated incision  GU exam:  not indicated       Assessment:   1. Postpartum exam  normal postpartum exam.   Plan:   Essential components of care per ACOG recommendations:  1.  Mood and well being: Patient with negative depression screening today. Reviewed local resources for support.  - Patient tobacco use? No.   - hx of drug use? No.    2. Infant care and feeding:  -Patient currently breastmilk feeding? No.  -Social determinants of health (SDOH) reviewed in EPIC.  3. Sexuality, contraception and birth spacing - Patient does not want a pregnancy in the next year. - Reviewed forms of contraception in tiered fashion. Patient desired bilateral tubal ligation   4. Sleep and fatigue -Encouraged family/partner/community support of 4 hrs of uninterrupted sleep to help with mood and  fatigue  5. Physical Recovery  - Discussed patients delivery and complications. She describes her labor as good. - Patient had a Vaginal, no problems at delivery. Patient had no laceration. Perineal healing reviewed. Patient expressed understanding - Patient has urinary incontinence? No. - Patient is safe to resume physical and sexual activity at 6 weeks ppartum.  6.  Health Maintenance - HM due items addressed Yes - Last pap smear  Diagnosis  Date Value Ref Range Status  02/03/2018   Final   NEGATIVE FOR INTRAEPITHELIAL LESIONS OR MALIGNANCY.  HPV negative  Pap smear not done at today's visit.  -Breast Cancer screening indicated? No.   7.  Chronic Disease/Pregnancy Condition follow up: None - PCP follow up, list given  Marylen Ponto, NP [facility] [employer], [facility]

## 2021-01-23 NOTE — Patient Instructions (Addendum)
AREA FAMILY PRACTICE PHYSICIANS  [place] ([address]) . [facility] o [address] [place], [place] [address] o [phone] o Mon-Fri 8:30-12:30, 1:30-5:00 o Accepting Medicaid . [facility] o [address], [place], [place] [address] o [phone] o Mon-Fri 8:00-5:30 . [facility] o [address] [place], [place] [address] o [phone] o Mon, Tue, Thur, Fri 8:30-5:00, Wed 10:00-7:00 (closed 1-2pm) o Accepting Medicaid . [facility] o [address], [place], [place]  [address] o Phone - [phone]   Fax - [phone]  [place] ([address]) . [facility] o [address] [place], [place] [address] o [phone] o Mon-Fri 8:00-5:00 . [facility] ([facility]  o [address] [place], [place] [address] o [phone] o Mon-Fri 8:30-5:30, Sat (Oct.-Mar.) 9:00-1:00 o Accepting Medicaid  [place] ([address]) . [facility] o [address], [place], [place] [address] o [phone] o Mon-Fri 8:00-5:00  [place] ([address]) . [facility] [facility] o [address], [place], [place] [address] o [phone] o Mon-Fri 8:00-5:00 . [facility] o [address], [place], [place] [address] o [phone] o Mon-Fri 8:00-5:00 . [facility] o [address] [place], [place] [address] o [phone] o Mon-Fri 8:00-5:00 . [facility] o [address] [place] [address] o [phone] o Mon-Fri 7:30-5:30  [place] ([address] & [address]) . [facility] o [address] [place], [place] [address] o [phone] o Mon-Thur 8:00-6:00 o Accepting Medicaid . [facility] o [address] [place], [place] [address] o [phone] o Mon-Thur 7:30-7:30, Fri 7:30-4:30 o Accepting Medicaid . [facility] o [address], [place], [place]  [address] o [phone]   Fax - [phone]  [place] ([address] & [address]) . [facility] o [address] [place], [place] [address] o [phone] o Mon-Fri 7:00-5:00 . [facility] o [address], [place], [place] [address] o [phone] o Mon-Fri 8:00-5:00 o Accepting Medicaid . [facility] o [address],  [place], [place] [address] o [phone] o Mon-Fri 8:00-5:00 o Accepting Medicaid  [place] ([address]) . [facility] [place] o [address] [place], [place] [address] o [phone] o Mon-Fri 8:00-5:00 . [facility] o [address], [place], [place] [address] o [phone] o Mon-Fri 8:00-5:00 o Accepting Medicaid . [facility] o [address], [place], [place] [address] o [phone] o Mon-Fri 8:00-5:30, Sat&Sun by appointment (phones open at 8:30) o Accepting Medicaid  [place] ([address]) . [facility] o [address] [place], [place] [address] o [phone] o Mon-Thur 8:00-7:00, Fri 8:00-5:00, Sat 8:00-12:00, Sun 9:00-12:00 o Accepting Medicaid . [facility] [place] o [address], [place], [place] [address] o [phone] o Mon-Thur 8:00-5:00 o Accepting Medicaid . [facility] o [address] [place],  [address] o [phone] o Mon-Fri 8:00-5:30, Sat (Oct.-Mar.) 9:00-1:00 o Accepting Medicaid  [place]  ([address]) .   [facility] o [address] [place], [place] [address] o [phone] o Mon-Fri 8:00-5:00 o Accepting Medicaid   [facility] [phone]) . [facility] o [address], [place], [place] [address] o [phone] o Mon-Fri 8:00-5:00 . Nature conservation officer at [facility] o [address], [place], [place] [address] o [phone] o Mon-Fri 8:00-5:00 . [facility] [place] o [address], [place], [place] [address] o [phone] o Mon-Fri 8:00-5:00 o After hours clinic [facility] [place] [address] [place], [place] [address]) [phone] Mon-Fri 5:00-8:00, Sat 12:00-6:00, Sun 10:00-4:00 o Accepting Medicaid . [facility] o [address] [place], [place]  [address] o [phone]   Fax - [phone]  [place] [phone]) . Nature conservation officer at [facility] o [address], [place], [place] [address] o [phone] o Mon-Fri 8:00-5:00 . [facility] [place] [facility] [place]) o [address] [place], [place] [address] o [phone] o Mon-Thur 8:00-7:00, Fri 8:00-5:00, Sat 8:00-12:00          Postpartum Tubal Ligation, Care After The following information offers guidance on how to care for yourself after your procedure. Your health care provider may also give you more specific instructions. If you have problems or questions, contact your health care provider. What can I expect after the procedure? After the procedure, you may have:  A sore throat if general anesthesia was used.  Bruising or pain in your back.  Nausea or vomiting.  Dizziness.  Mild abdominal discomfort or pain, such as cramping, gas pain, or feeling bloated.  Soreness around the incision area.  Tiredness.  Pain in your shoulders. This is caused by the gas that was used during the procedure. Follow these instructions at  home: Medicines  Ask your health care provider if the medicine prescribed to you: ? Requires you to avoid driving or using heavy machinery. ? Can cause constipation. You may need to take these actions to prevent or treat constipation:  Drink enough fluid to keep your urine pale yellow.  Take over-the-counter or prescription medicines.  Eat foods that are high in fiber, such as beans, whole grains, and fresh fruits and vegetables.  Limit foods that are high in fat and processed sugars, such as fried or sweet foods.  Do not take aspirin because it can cause bleeding. Activity  Rest for the remainder of the day.  Avoid sitting for a long time without moving. Get up to take short walks every 1-2 hours. This is important to improve blood flow and breathing. Ask for help if you feel weak or unsteady.  Do not have sex, douche, or put a tampon or anything else in your vagina for 6 weeks or as long as told by your health care provider.  Do not lift anything that is heavier than your baby for 2 weeks, or the limit that you are told, until your health care provider says that it is safe.  Return to your normal activities as told by your health care provider. Ask your health care provider what activities are safe for you. Incision care  Follow instructions from your health care provider about how to take care of your incision. Make sure you: ? Wash your hands with soap and water for at least 20 seconds before and after you change your bandage (dressing). If soap and water are not available, use hand  sanitizer. ? Change your dressing as told by your health care provider. ? Leave stitches (sutures), skin glue, or adhesive strips in place. These skin closures may need to stay in place for 2 weeks or longer. If adhesive strip edges start to loosen and curl up, you may trim the loose edges. Do not remove adhesive strips completely unless your health care provider tells you to do that.  Check your  incision area every day for signs of infection. Check for: ? Redness, swelling, or more pain. ? Fluid or blood. ? Warmth. ? Pus or a bad smell.      Other Instructions  Do not take baths, swim, or use a hot tub until your health care provider approves. Ask your health care provider if you may take showers. You may only be allowed to take sponge baths.  Keep all follow-up visits. This is important. Contact a health care provider if:  You have signs of infection, such as: ? Redness, swelling, or more pain around your incision. ? Fluid or blood coming from your incision. ? Your incision feels warm to the touch. ? You have pus or a bad smell coming from your incision.  Your pain does not improve after 2-3 days.  The edges of your incision break open after the sutures have been removed.  You have a rash.  You repeatedly become dizzy or lightheaded.  You have pain and pain medicine is not helping.  You are constipated. Get help right away if:  You have a fever or chills.  You faint.  You have pain in your abdomen that gets worse, and the pain is not relieved by pain medicine.  You have shortness of breath or trouble breathing.  You have chest pain, leg pain, or leg swelling.  You have ongoing nausea or diarrhea. These symptoms may represent a serious problem that is an emergency. Do not wait to see if the symptoms will go away. Get medical help right away. Call your local emergency services (911 in the [place] Do not drive yourself to the hospital. Summary  Mild abdominal discomfort is common after this procedure.  Contact your health care provider if you experience problems or have concerns.  Do not lift anything that is heavier than your baby for 2 weeks, or the limit that you are told, until your health care provider says that it is safe.  Keep all follow-up visits. This is important. This information is not intended to replace advice given to you by your health care  provider. Make sure you discuss any questions you have with your health care provider. Document Revised: 05/18/2020 Document Reviewed: 05/18/2020 Elsevier Patient Education  2021 [employer].

## 2021-02-05 ENCOUNTER — Encounter: Payer: Self-pay | Admitting: *Deleted

## 2021-02-05 ENCOUNTER — Other Ambulatory Visit: Payer: Self-pay | Admitting: Obstetrics and Gynecology

## 2021-02-07 ENCOUNTER — Other Ambulatory Visit: Payer: Self-pay

## 2021-02-07 DIAGNOSIS — O0990 Supervision of high risk pregnancy, unspecified, unspecified trimester: Secondary | ICD-10-CM

## 2021-02-07 MED ORDER — PRENATE PIXIE 10-0.6-0.4-200 MG PO CAPS: 1.0000 | ORAL_CAPSULE | Freq: Every day | ORAL | 5 refills | Status: DC

## 2021-11-29 ENCOUNTER — Encounter (HOSPITAL_COMMUNITY): Payer: Self-pay

## 2021-11-29 ENCOUNTER — Ambulatory Visit (HOSPITAL_COMMUNITY)
Admission: EM | Admit: 2021-11-29 | Discharge: 2021-11-29 | Disposition: A | Discharge status: Home / Self Care | Payer: Medicaid Other | Attending: Emergency Medicine | Admitting: Emergency Medicine

## 2021-11-29 ENCOUNTER — Other Ambulatory Visit: Payer: Self-pay

## 2021-11-29 DIAGNOSIS — M79622 Pain in left upper arm: Secondary | ICD-10-CM

## 2021-11-29 MED ORDER — NAPROXEN 250 MG PO TABS: 500.0000 mg | ORAL_TABLET | Freq: Two times a day (BID) | ORAL | 0 refills | Status: AC

## 2021-11-29 NOTE — ED Triage Notes (Signed)
Pt reports left arm pain and pressure x x 1 month. ?
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
## 2021-11-29 NOTE — Discharge Instructions (Addendum)
Your pain is most likely caused by irritation to the muscles or ligaments. I did not feel any lesions or bumps underneath your armpit today  ? ?You may take naproxen 2 tablets every mor ing and evening as needed when pain is present,  ? ?You may use heating pad in 15 minute intervals as needed for additional comfort or you may find comfort in using ice in 10-15 minutes over affected area ? ?Begin stretching affected area daily for 10 minutes as tolerated to further loosen muscles  ? ?When lying down place pillow underneath arm and behind back for comfort ? ?If pain persist after recommended treatment or reoccurs if may be beneficial to follow up with orthopedic specialist for evaluation, this doctor specializes in the bones and can manage your symptoms long-term with options such as but not limited to imaging, medications or physical therapy  ?  ?

## 2022-01-09 DIAGNOSIS — Z6825 Body mass index (BMI) 25.0-25.9, adult: Secondary | ICD-10-CM | POA: Diagnosis not present

## 2022-01-09 DIAGNOSIS — R300 Dysuria: Secondary | ICD-10-CM | POA: Diagnosis not present

## 2022-01-09 DIAGNOSIS — E039 Hypothyroidism, unspecified: Secondary | ICD-10-CM | POA: Diagnosis not present

## 2022-01-09 DIAGNOSIS — Z3200 Encounter for pregnancy test, result unknown: Secondary | ICD-10-CM | POA: Diagnosis not present

## 2022-01-09 DIAGNOSIS — Z0000 Encounter for general adult medical examination without abnormal findings: Secondary | ICD-10-CM | POA: Diagnosis not present

## 2022-01-09 DIAGNOSIS — L732 Hidradenitis suppurativa: Secondary | ICD-10-CM | POA: Diagnosis not present

## 2022-01-09 DIAGNOSIS — Z0130 Encounter for examination of blood pressure without abnormal findings: Secondary | ICD-10-CM | POA: Diagnosis not present

## 2022-01-09 DIAGNOSIS — E559 Vitamin D deficiency, unspecified: Secondary | ICD-10-CM | POA: Diagnosis not present

## 2022-01-16 DIAGNOSIS — E559 Vitamin D deficiency, unspecified: Secondary | ICD-10-CM | POA: Diagnosis not present

## 2022-01-16 DIAGNOSIS — E785 Hyperlipidemia, unspecified: Secondary | ICD-10-CM | POA: Diagnosis not present

## 2022-01-16 DIAGNOSIS — Z0130 Encounter for examination of blood pressure without abnormal findings: Secondary | ICD-10-CM | POA: Diagnosis not present

## 2022-01-16 DIAGNOSIS — L732 Hidradenitis suppurativa: Secondary | ICD-10-CM | POA: Diagnosis not present

## 2022-01-16 DIAGNOSIS — Z789 Other specified health status: Secondary | ICD-10-CM | POA: Diagnosis not present

## 2022-07-03 ENCOUNTER — Ambulatory Visit (HOSPITAL_COMMUNITY)
Admission: EM | Admit: 2022-07-03 | Discharge: 2022-07-03 | Disposition: A | Discharge status: Home / Self Care | Payer: Medicaid Other | Attending: Internal Medicine | Admitting: Internal Medicine

## 2022-07-03 ENCOUNTER — Encounter (HOSPITAL_COMMUNITY): Payer: Self-pay | Admitting: *Deleted

## 2022-07-03 DIAGNOSIS — L509 Urticaria, unspecified: Secondary | ICD-10-CM

## 2022-07-03 LAB — CBC WITH DIFFERENTIAL/PLATELET
Abs Immature Granulocytes: 0.01 10*3/uL (ref 0.00–0.07)
Basophils Absolute: 0 10*3/uL (ref 0.0–0.1)
Basophils Relative: 1 %
Eosinophils Absolute: 0.1 10*3/uL (ref 0.0–0.5)
Eosinophils Relative: 1 %
HCT: 40 % (ref 36.0–46.0)
Hemoglobin: 13.5 g/dL (ref 12.0–15.0)
Immature Granulocytes: 0 %
Lymphocytes Relative: 32 %
Lymphs Abs: 2.1 10*3/uL (ref 0.7–4.0)
MCH: 29.8 pg (ref 26.0–34.0)
MCHC: 33.8 g/dL (ref 30.0–36.0)
MCV: 88.3 fL (ref 80.0–100.0)
Monocytes Absolute: 0.4 10*3/uL (ref 0.1–1.0)
Monocytes Relative: 6 %
Neutro Abs: 3.9 10*3/uL (ref 1.7–7.7)
Neutrophils Relative %: 60 %
Platelets: 171 10*3/uL (ref 150–400)
RBC: 4.53 MIL/uL (ref 3.87–5.11)
RDW: 13.2 % (ref 11.5–15.5)
WBC: 6.5 10*3/uL (ref 4.0–10.5)
nRBC: 0 % (ref 0.0–0.2)

## 2022-07-03 LAB — COMPREHENSIVE METABOLIC PANEL
ALT: 17 U/L (ref 0–44)
AST: 20 U/L (ref 15–41)
Albumin: 4.2 g/dL (ref 3.5–5.0)
Alkaline Phosphatase: 47 U/L (ref 38–126)
Anion gap: 8 (ref 5–15)
BUN: 9 mg/dL (ref 6–20)
CO2: 24 mmol/L (ref 22–32)
Calcium: 9.5 mg/dL (ref 8.9–10.3)
Chloride: 107 mmol/L (ref 98–111)
Creatinine, Ser: 0.8 mg/dL (ref 0.44–1.00)
GFR, Estimated: >60 mL/min (ref >60)
Glucose, Bld: 90 mg/dL (ref 70–99)
Potassium: 4 mmol/L (ref 3.5–5.1)
Sodium: 139 mmol/L (ref 135–145)
Total Bilirubin: 0.9 mg/dL (ref 0.3–1.2)
Total Protein: 7.3 g/dL (ref 6.5–8.1)

## 2022-07-03 MED ORDER — LORATADINE 10 MG PO TABS: 10.0000 mg | ORAL_TABLET | Freq: Every day | ORAL | 1 refills | Status: AC

## 2022-07-03 NOTE — ED Triage Notes (Signed)
Pt states she has a rash all over her body that happens a lot. The rash is very itchy. She is taking an allergy med OTC, she doesn't know the name. She feels dizzy and sleepy when she takes the meds.

## 2022-07-03 NOTE — Discharge Instructions (Signed)
Please keep a diary of food eaten before the appearance of the rash Take medications as regularly as prescribed We will call you with recommendations if labs are abnormal Return to urgent care if symptoms worsen.
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
## 2023-01-20 ENCOUNTER — Telehealth: Payer: Self-pay

## 2023-01-20 NOTE — Telephone Encounter (Signed)
LVM for patient to schedule apt. AS, CMA 

## 2023-03-23 ENCOUNTER — Other Ambulatory Visit: Payer: Self-pay | Admitting: Internal Medicine

## 2023-03-23 DIAGNOSIS — Z131 Encounter for screening for diabetes mellitus: Secondary | ICD-10-CM | POA: Diagnosis not present

## 2023-03-23 DIAGNOSIS — Z0000 Encounter for general adult medical examination without abnormal findings: Secondary | ICD-10-CM | POA: Diagnosis not present

## 2023-03-23 DIAGNOSIS — M545 Low back pain, unspecified: Secondary | ICD-10-CM | POA: Diagnosis not present

## 2023-03-23 DIAGNOSIS — H6122 Impacted cerumen, left ear: Secondary | ICD-10-CM | POA: Diagnosis not present

## 2023-03-23 DIAGNOSIS — Z124 Encounter for screening for malignant neoplasm of cervix: Secondary | ICD-10-CM | POA: Diagnosis not present

## 2023-03-23 DIAGNOSIS — J302 Other seasonal allergic rhinitis: Secondary | ICD-10-CM | POA: Diagnosis not present

## 2023-03-23 DIAGNOSIS — Z13220 Encounter for screening for lipoid disorders: Secondary | ICD-10-CM | POA: Diagnosis not present

## 2023-03-23 DIAGNOSIS — Z1239 Encounter for other screening for malignant neoplasm of breast: Secondary | ICD-10-CM | POA: Diagnosis not present

## 2023-03-23 DIAGNOSIS — E559 Vitamin D deficiency, unspecified: Secondary | ICD-10-CM | POA: Diagnosis not present

## 2023-03-24 LAB — COMPLETE METABOLIC PANEL WITH GFR
AG Ratio: 1.5 (calc) (ref 1.0–2.5)
ALT: 32 U/L — ABNORMAL HIGH (ref 6–29)
AST: 21 U/L (ref 10–30)
Albumin: 4.2 g/dL (ref 3.6–5.1)
Alkaline phosphatase (APISO): 61 U/L (ref 31–125)
BUN: 15 mg/dL (ref 7–25)
CO2: 20 mmol/L (ref 20–32)
Calcium: 9.7 mg/dL (ref 8.6–10.2)
Chloride: 108 mmol/L (ref 98–110)
Creat: 0.71 mg/dL (ref 0.50–0.99)
Globulin: 2.8 g/dL (calc) (ref 1.9–3.7)
Glucose, Bld: 98 mg/dL (ref 65–99)
Potassium: 4 mmol/L (ref 3.5–5.3)
Sodium: 137 mmol/L (ref 135–146)
Total Bilirubin: 0.3 mg/dL (ref 0.2–1.2)
Total Protein: 7 g/dL (ref 6.1–8.1)
eGFR: 110 mL/min/{1.73_m2} (ref >=60)

## 2023-03-24 LAB — VITAMIN D 25 HYDROXY (VIT D DEFICIENCY, FRACTURES): Vit D, 25-Hydroxy: 32 ng/mL (ref 30–100)

## 2023-03-24 LAB — LIPID PANEL
Cholesterol: 186 mg/dL (ref <200)
HDL: 74 mg/dL (ref >=50)
LDL Cholesterol (Calc): 95 mg/dL (calc)
Non-HDL Cholesterol (Calc): 112 mg/dL (calc) (ref <130)
Total CHOL/HDL Ratio: 2.5 (calc) (ref <5.0)
Triglycerides: 83 mg/dL (ref <150)

## 2023-03-24 LAB — CBC
HCT: 40.4 % (ref 35.0–45.0)
Hemoglobin: 13.4 g/dL (ref 11.7–15.5)
MCH: 30.2 pg (ref 27.0–33.0)
MCHC: 33.2 g/dL (ref 32.0–36.0)
MCV: 91 fL (ref 80.0–100.0)
MPV: 10.7 fL (ref 7.5–12.5)
Platelets: 167 10*3/uL (ref 140–400)
RBC: 4.44 10*6/uL (ref 3.80–5.10)
RDW: 12.8 % (ref 11.0–15.0)
WBC: 5.7 10*3/uL (ref 3.8–10.8)

## 2023-04-01 ENCOUNTER — Telehealth: Payer: Self-pay

## 2023-04-01 NOTE — Telephone Encounter (Signed)
 Left a message on v-mail

## 2023-05-04 DIAGNOSIS — J302 Other seasonal allergic rhinitis: Secondary | ICD-10-CM | POA: Diagnosis not present

## 2023-05-04 DIAGNOSIS — Z1239 Encounter for other screening for malignant neoplasm of breast: Secondary | ICD-10-CM | POA: Diagnosis not present

## 2023-05-04 DIAGNOSIS — E669 Obesity, unspecified: Secondary | ICD-10-CM | POA: Diagnosis not present

## 2023-05-04 DIAGNOSIS — Z124 Encounter for screening for malignant neoplasm of cervix: Secondary | ICD-10-CM | POA: Diagnosis not present

## 2023-05-04 DIAGNOSIS — M5431 Sciatica, right side: Secondary | ICD-10-CM | POA: Diagnosis not present

## 2023-05-04 DIAGNOSIS — L720 Epidermal cyst: Secondary | ICD-10-CM | POA: Diagnosis not present

## 2023-05-05 ENCOUNTER — Other Ambulatory Visit: Payer: Self-pay | Admitting: Internal Medicine

## 2023-05-05 DIAGNOSIS — Z1231 Encounter for screening mammogram for malignant neoplasm of breast: Secondary | ICD-10-CM

## 2023-05-15 ENCOUNTER — Ambulatory Visit
Admission: RE | Admit: 2023-05-15 | Discharge: 2023-05-15 | Disposition: A | Discharge status: Home / Self Care | Payer: Medicaid Other | Source: Ambulatory Visit | Attending: Internal Medicine | Admitting: Internal Medicine

## 2023-05-15 DIAGNOSIS — Z1231 Encounter for screening mammogram for malignant neoplasm of breast: Secondary | ICD-10-CM | POA: Diagnosis not present

## 2023-06-16 ENCOUNTER — Other Ambulatory Visit (HOSPITAL_COMMUNITY)
Admission: RE | Admit: 2023-06-16 | Discharge: 2023-06-16 | Disposition: A | Discharge status: Home / Self Care | Payer: Medicaid Other | Source: Ambulatory Visit | Attending: Obstetrics and Gynecology | Admitting: Obstetrics and Gynecology

## 2023-06-16 ENCOUNTER — Encounter: Payer: Self-pay | Admitting: Obstetrics and Gynecology

## 2023-06-16 ENCOUNTER — Ambulatory Visit: Payer: Medicaid Other | Admitting: Obstetrics and Gynecology

## 2023-06-16 VITALS — BP 113/76 | HR 71 | Ht <= 58 in | Wt 136.0 lb

## 2023-06-16 DIAGNOSIS — Z01419 Encounter for gynecological examination (general) (routine) without abnormal findings: Secondary | ICD-10-CM | POA: Insufficient documentation

## 2023-06-16 DIAGNOSIS — Z1339 Encounter for screening examination for other mental health and behavioral disorders: Secondary | ICD-10-CM | POA: Diagnosis not present

## 2023-06-16 DIAGNOSIS — Z23 Encounter for immunization: Secondary | ICD-10-CM | POA: Diagnosis not present

## 2023-06-16 NOTE — Progress Notes (Signed)
ANNUAL EXAM Patient name: Jill [name] MRN [id number]  Date of birth: [birth year] Chief Complaint:   Gynecologic Exam  History of Present Illness:   Jill [name] is a 42 y.o. [phone] with Patient's last menstrual period was 05/27/2023 (approximate). being seen today for a routine annual exam.  Current complaints: None  Pt thought she had a hysterectomy. Counseled on BTL, reviewed her remaining anatomy.    [facility]stream - 06/16/23 0826       Pregnancy Intention Screening   Does the patient want to become pregnant in the next year? No    Does the patient's partner want to become pregnant in the next year? No    Would the patient like to discuss contraceptive options today? No      Contraception Wrap Up   Current Method Female Sterilization            The pregnancy intention screening data noted above was reviewed. Potential methods of contraception were discussed. The patient elected to proceed with No data recorded.   Last pap 02/03/18. Results were: NILM w/ HRHPV negative. H/O abnormal pap: no Last mammogram: 05/15/23. Results were: normal. Family h/o breast cancer: no Last colonoscopy: n/a. Results were: N/A. Family h/o colorectal cancer: no     06/16/2023    8:25 AM  Depression screen PHQ 2/9  Decreased Interest 0  Down, Depressed, Hopeless 0  PHQ - 2 Score 0  Altered sleeping 0  Tired, decreased energy 0  Change in appetite 0  Feeling bad or failure about yourself  0  Trouble concentrating 0  Moving slowly or fidgety/restless 0  Suicidal thoughts 0  PHQ-9 Score 0  Difficult doing work/chores Not difficult at all        06/16/2023    8:26 AM 06/08/2020   10:06 AM  GAD 7 : Generalized Anxiety Score  Nervous, Anxious, on Edge 0 0  Control/stop worrying 0 0  Worry too much - different things 0 0  Trouble relaxing 0 0  Restless 0 0  Easily annoyed or irritable 0 0  Afraid - awful might happen 0 0  Total GAD 7 Score 0 0  Anxiety Difficulty Not difficult at all Not  difficult at all     Review of Systems:   Pertinent items are noted in HPI Denies any headaches, blurred vision, fatigue, shortness of breath, chest pain, abdominal pain, abnormal vaginal discharge/itching/odor/irritation, problems with periods, bowel movements, urination, or intercourse unless otherwise stated above. Pertinent History Reviewed:  Reviewed past medical,surgical, social and family history.  Reviewed problem list, medications and allergies. Physical Assessment:   Vitals:   06/16/23 0813  BP: 113/76  Pulse: 71  Weight: 136 lb (61.7 kg)  Height: 4\' 8"  (1.422 m)  Body mass index is 30.49 kg/m.        Physical Examination:   General appearance - well appearing, and in no distress  Mental status - alert, oriented to person, place, and time  Chest - respiratory effort normal  Heart - normal peripheral perfusion  Breasts - deferred  Abdomen - soft, nontender, nondistended, no masses or organomegaly  Pelvic - VULVA: normal appearing vulva with no masses, tenderness or lesions  VAGINA: normal appearing vagina with normal color and discharge, no lesions  CERVIX: normal appearing cervix without discharge or lesions, no CMT  Thin prep pap is done with HR HPV cotesting  Chaperone present for exam  No results found for this or any previous visit (from the past 24  hour(s)).  Assessment & Plan:  1) Well-Woman Exam Mammogram: in 1 year, or sooner if problems Colonoscopy: @ 40yo, or sooner if problems Pap: Collected  Gardasil: n/a GC/CT: Collected HIV/HCV: Ordered Flu shot given today  2) Language barrier Pt counseled & interviewed with [name] interpreter  Follow-up: Return in about 1 year (around 06/15/2024) for or sooner as needed.  Lennart Pall, MD 06/16/2023 8:36 AM

## 2023-06-16 NOTE — Patient Instructions (Signed)
It was nice to meet you today! You will receive a call about your pap test results within 2 weeks.

## 2023-06-16 NOTE — Progress Notes (Addendum)
42 y.o. GYN presents for AEX/STD screening.  HO BTS 12/25/20. Last Mammogram 05/15/23

## 2023-06-17 LAB — HEPATITIS C ANTIBODY: Hep C Virus Ab: NONREACTIVE

## 2023-06-17 LAB — CERVICOVAGINAL ANCILLARY ONLY
Chlamydia: NEGATIVE
Comment: NEGATIVE
Comment: NEGATIVE
Comment: NORMAL
Neisseria Gonorrhea: NEGATIVE
Trichomonas: NEGATIVE

## 2023-06-17 LAB — RPR: RPR Ser Ql: NONREACTIVE

## 2023-06-17 LAB — HEPATITIS B SURFACE ANTIGEN: Hepatitis B Surface Ag: NEGATIVE

## 2023-06-17 LAB — HIV ANTIBODY (ROUTINE TESTING W REFLEX): HIV Screen 4th Generation wRfx: NONREACTIVE

## 2023-06-18 LAB — CYTOLOGY - PAP
Comment: NEGATIVE
Diagnosis: NEGATIVE
High risk HPV: NEGATIVE

## 2023-08-29 ENCOUNTER — Encounter (HOSPITAL_COMMUNITY): Payer: Self-pay | Admitting: Emergency Medicine

## 2023-08-29 ENCOUNTER — Other Ambulatory Visit: Payer: Self-pay

## 2023-08-29 ENCOUNTER — Ambulatory Visit (HOSPITAL_COMMUNITY)
Admission: EM | Admit: 2023-08-29 | Discharge: 2023-08-29 | Disposition: A | Discharge status: Home / Self Care | Payer: Medicaid Other | Attending: Emergency Medicine | Admitting: Emergency Medicine

## 2023-08-29 DIAGNOSIS — K219 Gastro-esophageal reflux disease without esophagitis: Secondary | ICD-10-CM

## 2023-08-29 MED ORDER — FAMOTIDINE 20 MG PO TABS: 20.0000 mg | ORAL_TABLET | Freq: Two times a day (BID) | ORAL | 0 refills | Status: AC

## 2023-08-29 MED ORDER — LIDOCAINE VISCOUS HCL 2 % MT SOLN
OROMUCOSAL | Status: AC
  Filled 2023-08-29: qty 15

## 2023-08-29 MED ORDER — LIDOCAINE VISCOUS HCL 2 % MT SOLN
15.0000 mL | Freq: Once | OROMUCOSAL | Status: AC
  Administered 2023-08-29: 15 mL via OROMUCOSAL

## 2023-08-29 MED ORDER — ALUM & MAG HYDROXIDE-SIMETH 200-200-20 MG/5ML PO SUSP
ORAL | Status: AC
  Filled 2023-08-29: qty 30

## 2023-08-29 MED ORDER — ALUM & MAG HYDROXIDE-SIMETH 200-200-20 MG/5ML PO SUSP
30.0000 mL | Freq: Once | ORAL | Status: AC
  Administered 2023-08-29: 30 mL via ORAL
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
## 2023-08-29 NOTE — ED Triage Notes (Signed)
Pt c/o heartburn for over a week now, don't getting better, pt denies any nausea or vomiting.

## 2023-08-29 NOTE — Discharge Instructions (Signed)
Please take the pepcid 1 or 2 times daily as needed for pain Avoid foods that cause symptoms Follow with primary care provider  Please go to the emergency department if symptoms worsen.

## 2023-11-27 DIAGNOSIS — J302 Other seasonal allergic rhinitis: Secondary | ICD-10-CM | POA: Diagnosis not present

## 2023-11-27 DIAGNOSIS — K219 Gastro-esophageal reflux disease without esophagitis: Secondary | ICD-10-CM | POA: Diagnosis not present

## 2023-11-27 DIAGNOSIS — N760 Acute vaginitis: Secondary | ICD-10-CM | POA: Diagnosis not present

## 2023-12-11 DIAGNOSIS — K219 Gastro-esophageal reflux disease without esophagitis: Secondary | ICD-10-CM | POA: Diagnosis not present

## 2024-02-26 DIAGNOSIS — K219 Gastro-esophageal reflux disease without esophagitis: Secondary | ICD-10-CM | POA: Diagnosis not present

## 2024-02-26 DIAGNOSIS — Z0000 Encounter for general adult medical examination without abnormal findings: Secondary | ICD-10-CM | POA: Diagnosis not present

## 2024-02-26 DIAGNOSIS — Z131 Encounter for screening for diabetes mellitus: Secondary | ICD-10-CM | POA: Diagnosis not present

## 2024-02-26 DIAGNOSIS — Z1239 Encounter for other screening for malignant neoplasm of breast: Secondary | ICD-10-CM | POA: Diagnosis not present

## 2024-02-26 DIAGNOSIS — E559 Vitamin D deficiency, unspecified: Secondary | ICD-10-CM | POA: Diagnosis not present

## 2024-02-26 DIAGNOSIS — Z13220 Encounter for screening for lipoid disorders: Secondary | ICD-10-CM | POA: Diagnosis not present

## 2024-04-08 DIAGNOSIS — K219 Gastro-esophageal reflux disease without esophagitis: Secondary | ICD-10-CM | POA: Diagnosis not present

## 2024-04-08 DIAGNOSIS — J302 Other seasonal allergic rhinitis: Secondary | ICD-10-CM | POA: Diagnosis not present

## 2024-04-08 DIAGNOSIS — E669 Obesity, unspecified: Secondary | ICD-10-CM | POA: Diagnosis not present

## 2024-04-15 ENCOUNTER — Other Ambulatory Visit: Payer: Self-pay | Admitting: Internal Medicine

## 2024-04-15 DIAGNOSIS — Z0000 Encounter for general adult medical examination without abnormal findings: Secondary | ICD-10-CM

## 2024-05-17 ENCOUNTER — Ambulatory Visit

## 2024-05-17 ENCOUNTER — Ambulatory Visit
Admission: RE | Admit: 2024-05-17 | Discharge: 2024-05-17 | Disposition: A | Discharge status: Home / Self Care | Source: Ambulatory Visit | Attending: Internal Medicine | Admitting: Internal Medicine

## 2024-05-17 DIAGNOSIS — Z0000 Encounter for general adult medical examination without abnormal findings: Secondary | ICD-10-CM

## 2024-07-08 DIAGNOSIS — K219 Gastro-esophageal reflux disease without esophagitis: Secondary | ICD-10-CM | POA: Diagnosis not present

## 2024-07-08 DIAGNOSIS — J302 Other seasonal allergic rhinitis: Secondary | ICD-10-CM | POA: Diagnosis not present
